# Patient Record
Sex: Female | Born: 1935 | Race: White | Hispanic: No | Smoking: Former smoker
Health system: Southern US, Community
[De-identification: ages and names within clinical notes are randomized; demographics above are authoritative.]

## PROBLEM LIST (undated history)

## (undated) DIAGNOSIS — E559 Vitamin D deficiency, unspecified: Secondary | ICD-10-CM

## (undated) DIAGNOSIS — F419 Anxiety disorder, unspecified: Secondary | ICD-10-CM

## (undated) DIAGNOSIS — M8580 Other specified disorders of bone density and structure, unspecified site: Secondary | ICD-10-CM

## (undated) DIAGNOSIS — F32A Depression, unspecified: Secondary | ICD-10-CM

## (undated) DIAGNOSIS — C801 Malignant (primary) neoplasm, unspecified: Secondary | ICD-10-CM

## (undated) DIAGNOSIS — K635 Polyp of colon: Secondary | ICD-10-CM

## (undated) DIAGNOSIS — R739 Hyperglycemia, unspecified: Secondary | ICD-10-CM

## (undated) DIAGNOSIS — E785 Hyperlipidemia, unspecified: Secondary | ICD-10-CM

## (undated) DIAGNOSIS — R32 Unspecified urinary incontinence: Secondary | ICD-10-CM

## (undated) DIAGNOSIS — F329 Major depressive disorder, single episode, unspecified: Secondary | ICD-10-CM

## (undated) DIAGNOSIS — L570 Actinic keratosis: Secondary | ICD-10-CM

## (undated) HISTORY — DX: Vitamin D deficiency, unspecified: E55.9

## (undated) HISTORY — DX: Other specified disorders of bone density and structure, unspecified site: M85.80

## (undated) HISTORY — DX: Hyperlipidemia, unspecified: E78.5

## (undated) HISTORY — PX: BLADDER SURGERY: SHX569

## (undated) HISTORY — DX: Major depressive disorder, single episode, unspecified: F32.9

## (undated) HISTORY — DX: Polyp of colon: K63.5

## (undated) HISTORY — DX: Actinic keratosis: L57.0

## (undated) HISTORY — DX: Hyperglycemia, unspecified: R73.9

## (undated) HISTORY — DX: Anxiety disorder, unspecified: F41.9

## (undated) HISTORY — DX: Malignant (primary) neoplasm, unspecified: C80.1

## (undated) HISTORY — PX: EYE SURGERY: SHX253

## (undated) HISTORY — DX: Unspecified urinary incontinence: R32

## (undated) HISTORY — DX: Depression, unspecified: F32.A

---

## 2005-06-01 ENCOUNTER — Ambulatory Visit: Payer: Self-pay | Admitting: Internal Medicine

## 2006-07-19 ENCOUNTER — Ambulatory Visit: Payer: Self-pay | Admitting: Internal Medicine

## 2007-10-31 ENCOUNTER — Ambulatory Visit: Payer: Self-pay | Admitting: Internal Medicine

## 2008-11-05 ENCOUNTER — Ambulatory Visit: Payer: Self-pay | Admitting: Internal Medicine

## 2009-11-10 ENCOUNTER — Ambulatory Visit: Payer: Self-pay | Admitting: Internal Medicine

## 2011-12-12 ENCOUNTER — Ambulatory Visit: Payer: Self-pay | Admitting: Internal Medicine

## 2013-03-31 ENCOUNTER — Ambulatory Visit (INDEPENDENT_AMBULATORY_CARE_PROVIDER_SITE_OTHER): Payer: BC Managed Care – PPO | Admitting: Podiatry

## 2013-03-31 ENCOUNTER — Ambulatory Visit: Payer: Self-pay | Admitting: Podiatry

## 2013-03-31 ENCOUNTER — Encounter: Payer: Self-pay | Admitting: Podiatry

## 2013-03-31 VITALS — BP 118/68 | HR 62 | Resp 18 | Ht 64.0 in | Wt 143.0 lb

## 2013-03-31 DIAGNOSIS — M79609 Pain in unspecified limb: Secondary | ICD-10-CM

## 2013-03-31 DIAGNOSIS — B351 Tinea unguium: Secondary | ICD-10-CM

## 2013-03-31 NOTE — Progress Notes (Signed)
Trim toenails .  Objective: Vital signs are stable she is alert and oriented x3. Pulses are palpable bilateral. Nails are thick yellow dystrophic clinically mycotic and painful palpation.  Assessment: Pain in limb secondary to onychomycosis 1 through 5 bilateral.  Plan: Debridement of nails 1 through 5 bilateral covered service secondary to pain.

## 2013-04-14 ENCOUNTER — Ambulatory Visit: Payer: Self-pay | Admitting: Podiatry

## 2013-04-16 ENCOUNTER — Ambulatory Visit: Payer: Self-pay | Admitting: Podiatry

## 2015-07-22 ENCOUNTER — Encounter: Payer: Self-pay | Admitting: Psychiatry

## 2015-07-22 ENCOUNTER — Ambulatory Visit (INDEPENDENT_AMBULATORY_CARE_PROVIDER_SITE_OTHER): Payer: 59 | Admitting: Psychiatry

## 2015-07-22 VITALS — BP 122/66 | HR 61 | Ht 64.0 in | Wt 145.4 lb

## 2015-07-22 DIAGNOSIS — F331 Major depressive disorder, recurrent, moderate: Secondary | ICD-10-CM | POA: Diagnosis not present

## 2015-07-22 DIAGNOSIS — E785 Hyperlipidemia, unspecified: Secondary | ICD-10-CM | POA: Insufficient documentation

## 2015-07-22 DIAGNOSIS — R739 Hyperglycemia, unspecified: Secondary | ICD-10-CM | POA: Insufficient documentation

## 2015-07-22 DIAGNOSIS — M8580 Other specified disorders of bone density and structure, unspecified site: Secondary | ICD-10-CM | POA: Insufficient documentation

## 2015-07-22 DIAGNOSIS — E559 Vitamin D deficiency, unspecified: Secondary | ICD-10-CM | POA: Insufficient documentation

## 2015-07-22 DIAGNOSIS — I10 Essential (primary) hypertension: Secondary | ICD-10-CM | POA: Insufficient documentation

## 2015-07-22 MED ORDER — BUSPIRONE HCL 5 MG PO TABS: 5.0000 mg | ORAL_TABLET | Freq: Every day | ORAL | Status: DC

## 2015-07-22 NOTE — Progress Notes (Signed)
Psychiatric Initial Adult Assessment   Patient Identification: Victoria Marquez MRN:  [id number] Date of Evaluation:  07/22/2015 Referral Source: PCP- [facility]  Chief Complaint:   Chief Complaint    Establish Care; Anxiety; Depression     Visit Diagnosis:    ICD-9-CM ICD-10-CM   1. MDD (major depressive disorder), recurrent episode, moderate (HCC) 296.32 F33.1     History of Present Illness:    Patient is a 82 year old widowed female who presented for initial assessment. She reported that she is having anxiety and depression as she is approaching her 90 per day. Her daughter-in-law who lives in [place] made arrangement for her 19 per day but she became very anxious and depressed and she asked her to cancel the program. Her sons and daughters are very excited and they were planning for the party. However patient reported that she is very introverted and stoic and does not want to have any programs. She currently lives in twin [place]. She does not want to invite her friends and discuss with them about her 89th per day. She reported that she has been becoming more apprehensive and was having anxiety. She spends most of the time reading books and living by herself. She reported that she started having options with her sleep. Currently she denied having any worsening of her symptoms as now she is planning to spend weekends with her children on an individual basis. Patient reported that she is also planning to go to [place] with her daughter. One of her daughter lives in [place]. Her son also lives in [place]. Her other daughter lives in [place]. She has good relationship with her children. She currently denied having any suicidal ideations or plans. She stated that she  has good eating habits.   She is willing to try medications to help with her anxiety at this time.   Associated Signs/Symptoms: Depression Symptoms:  depressed mood, difficulty concentrating, anxiety, (Hypo)  Manic Symptoms:  none Anxiety Symptoms:  Excessive Worry, Psychotic Symptoms:  none PTSD Symptoms: Negative NA  Past Psychiatric History: Has seen a Psychiatrist 50 years ago.   Previous Psychotropic Medications: none  Substance Abuse History in the last 12 months:  No.  Consequences of Substance Abuse: Negative NA  Past Medical History:  Past Medical History  Diagnosis Date  . Cancer ([place])   . Anxiety   . Depression   . Colon polyps   . Hyperglycemia   . Hyperlipemia   . Osteopenia   . Urinary incontinence   . Vitamin D deficiency     Past Surgical History  Procedure Laterality Date  . Bladder surgery    . Eye surgery      Family Psychiatric History:  Oldest daughter has MS Other daughter has Fibromylagia.   Family History: History reviewed. No pertinent family history.  Social History:   Social History   Social History  . Marital Status: Married    Spouse Name: N/A  . Number of Children: N/A  . Years of Education: N/A   Social History Main Topics  . Smoking status: Former Smoker    Types: Cigarettes    Quit date: 07/22/1990  . Smokeless tobacco: Never Used  . Alcohol Use: 0.0 oz/week    0 Standard drinks or equivalent per week  . Drug Use: No  . Sexual Activity: Not Currently   Other Topics Concern  . None   Social History Narrative    Additional Social History:  4 children, 5 grandchildren.  Widowed x 2 years. Married x 62 years.   Allergies:   Allergies  Allergen Reactions  . Codeine Other (See Comments)    Does not remember reaction     Metabolic Disorder Labs: No results found for: HGBA1C, MPG No results found for: PROLACTIN No results found for: CHOL, TRIG, HDL, CHOLHDL, VLDL, LDLCALC   Current Medications: Current Outpatient Prescriptions  Medication Sig Dispense Refill  . alendronate (FOSAMAX) 70 MG tablet     . amLODipine (NORVASC) 5 MG tablet TAKE 1 TABLET (5 MG TOTAL) BY MOUTH ONCE DAILY.  4  . azelastine (ASTELIN)  0.1 % nasal spray Place into the nose.    . Biotin 1000 MCG tablet Take by mouth.    . Cholecalciferol (VITAMIN D-1000 MAX ST) 1000 units tablet Take by mouth.    . ENABLEX 15 MG 24 hr tablet     . fluticasone (FLONASE) 50 MCG/ACT nasal spray Place into the nose.    . Folic Acid 20 MG CAPS Take by mouth.    . losartan (COZAAR) 100 MG tablet     . Omega-3 Fatty Acids (FISH OIL) 1000 MG CAPS Take by mouth.    . pantoprazole (PROTONIX) 40 MG tablet      No current facility-administered medications for this visit.    Neurologic: Headache: No Seizure: No Paresthesias:No  Musculoskeletal: Strength & Muscle Tone: within normal limits Gait & Station: normal Patient leans: N/A  Psychiatric Specialty Exam: ROS  Blood pressure 122/66, pulse 61, height 5\' 4"  (1.626 m), weight 145 lb 6.4 oz (65.953 kg), SpO2 91 %.Body mass index is 24.95 kg/(m^2).  General Appearance: Casual and Fairly Groomed  Eye Contact:  Fair  Speech:  Slow  Volume:  Normal  Mood:  Depressed  Affect:  Appropriate and Congruent  Thought Process:  Coherent and Goal Directed  Orientation:  Full (Time, Place, and Person)  Thought Content:  WDL  Suicidal Thoughts:  No  Homicidal Thoughts:  No  Memory:  Immediate;   Fair Recent;   Fair  Judgement:  Fair  Insight:  Fair  Psychomotor Activity:  Normal  Concentration:  Concentration: Fair and Attention Span: Fair  Recall:  [employer] of Knowledge:Fair  Language: Fair  Akathisia:  No  Handed:  Right  AIMS (if indicated):    Assets:  Communication Skills Desire for Improvement Leisure Time Physical Health Social Support  ADL's:  Intact  Cognition: WNL  Sleep:  fair    Treatment Plan Summary: Medication management   Discussed with her about the medications and she agreed for a trial of BuSpar 5 mg daily. Discussed with her about the side effects in detail and she demonstrated understanding. Follow up in 2 weeks or earlier depending on her symptoms.    More  than 50% of the time spent in psychoeducation, counseling and coordination of care.    This note was generated in part or whole with voice recognition software. Voice regonition is usually quite accurate but there are transcription errors that can and very often do occur. I apologize for any typographical errors that were not detected and corrected.    Rainey Pines, MD 7/13/201711:17 AM

## 2015-08-05 ENCOUNTER — Encounter: Payer: Self-pay | Admitting: Psychiatry

## 2015-08-05 ENCOUNTER — Ambulatory Visit (INDEPENDENT_AMBULATORY_CARE_PROVIDER_SITE_OTHER): Payer: 59 | Admitting: Psychiatry

## 2015-08-05 ENCOUNTER — Ambulatory Visit (INDEPENDENT_AMBULATORY_CARE_PROVIDER_SITE_OTHER): Payer: 59 | Admitting: Licensed Clinical Social Worker

## 2015-08-05 VITALS — BP 131/73 | HR 70 | Temp 97.7°F | Ht 64.0 in | Wt 147.2 lb

## 2015-08-05 DIAGNOSIS — F4323 Adjustment disorder with mixed anxiety and depressed mood: Secondary | ICD-10-CM

## 2015-08-05 NOTE — Progress Notes (Signed)
Psychiatric MD Progress Note   Patient Identification: Victoria Marquez MRN:  [id number] Date of Evaluation:  08/05/2015 Referral Source: PCP- [facility]  Chief Complaint:   Chief Complaint    Follow-up; Medication Refill     Visit Diagnosis:    ICD-9-CM ICD-10-CM   1. Adjustment disorder with mixed anxiety and depressed mood 309.28 F43.23     History of Present Illness:    Patient is a 82 year old widowed female who presented for Follow-up. She reported that she has been doing well and does not have any acute issues. She reported that she has been taking her BuSpar as prescribed. Patient reported that she did not notice any worsening of her symptoms and did not notice any improvement either. She wants to start doing therapy to discuss about her personality issues in the past. She has good relationship with her children. She did not have any acute issues at this time. Patient reported that she is very proud of her health as she does not have any symptoms and her mother lived to be 70 years old.    She has lots of energy and she reads books throughout the day and sleeps approximately 5-6 hours on a daily basis. She currently denied having any suicidal ideations or plans. She denied having any memory issues.   She currently lives in twin [place].  She has good relationship with her children. She currently denied having any suicidal ideations or plans. She stated that she  has good eating habits.    Associated Signs/Symptoms: Depression Symptoms:  difficulty concentrating, anxiety, (Hypo) Manic Symptoms:  none Anxiety Symptoms:  Excessive Worry, Psychotic Symptoms:  none PTSD Symptoms: Negative NA  Past Psychiatric History: Has seen a Psychiatrist 50 years ago.   Previous Psychotropic Medications: none  Substance Abuse History in the last 12 months:  No.  Consequences of Substance Abuse: Negative NA  Past Medical History:  Past Medical History:  Diagnosis Date  . Anxiety    . Cancer ([place])   . Colon polyps   . Depression   . Hyperglycemia   . Hyperlipemia   . Osteopenia   . Urinary incontinence   . Vitamin D deficiency     Past Surgical History:  Procedure Laterality Date  . BLADDER SURGERY    . EYE SURGERY      Family Psychiatric History:  Oldest daughter has MS Other daughter has [place].   Family History: History reviewed. No pertinent family history.  Social History:   Social History   Social History  . Marital status: Married    Spouse name: N/A  . Number of children: N/A  . Years of education: N/A   Social History Main Topics  . Smoking status: Former Smoker    Types: Cigarettes    Quit date: 07/22/1990  . Smokeless tobacco: Never Used  . Alcohol use 0.0 oz/week  . Drug use: No  . Sexual activity: Not Currently   Other Topics Concern  . None   Social History Narrative  . None    Additional Social History:  4 children, 5 grandchildren.  Widowed x 2 years. Married x 62 years.   Allergies:   Allergies  Allergen Reactions  . Codeine Other (See Comments)    Does not remember reaction     Metabolic Disorder Labs: No results found for: HGBA1C, MPG No results found for: PROLACTIN No results found for: CHOL, TRIG, HDL, CHOLHDL, VLDL, LDLCALC   Current Medications: Current Outpatient Prescriptions  Medication Sig Dispense Refill  .  alendronate (FOSAMAX) 70 MG tablet     . amLODipine (NORVASC) 5 MG tablet TAKE 1 TABLET (5 MG TOTAL) BY MOUTH ONCE DAILY.  4  . azelastine (ASTELIN) 0.1 % nasal spray Place into the nose.    . Biotin 1000 MCG tablet Take by mouth.    . busPIRone (BUSPAR) 5 MG tablet Take 1 tablet (5 mg total) by mouth daily after breakfast. 30 tablet 1  . Cholecalciferol (VITAMIN D-1000 MAX ST) 1000 units tablet Take by mouth.    . ENABLEX 15 MG 24 hr tablet     . fluticasone (FLONASE) 50 MCG/ACT nasal spray Place into the nose.    . Folic Acid 20 MG CAPS Take by mouth.    . losartan (COZAAR) 100 MG  tablet     . Omega-3 Fatty Acids (FISH OIL) 1000 MG CAPS Take by mouth.    . pantoprazole (PROTONIX) 40 MG tablet      No current facility-administered medications for this visit.     Neurologic: Headache: No Seizure: No Paresthesias:No  Musculoskeletal: Strength & Muscle Tone: within normal limits Gait & Station: normal Patient leans: N/A  Psychiatric Specialty Exam: Review of Systems  Psychiatric/Behavioral: Positive for depression. The patient is nervous/anxious and has insomnia.     Blood pressure 131/73, pulse 70, temperature 97.7 F (36.5 C), temperature source Oral, height 5\' 4"  (1.626 m), weight 147 lb 3.2 oz (66.8 kg).Body mass index is 25.27 kg/m.  General Appearance: Casual and Fairly Groomed  Eye Contact:  Fair  Speech:  Slow  Volume:  Normal  Mood:  Depressed  Affect:  Appropriate and Congruent  Thought Process:  Coherent and Goal Directed  Orientation:  Full (Time, Place, and Person)  Thought Content:  WDL  Suicidal Thoughts:  No  Homicidal Thoughts:  No  Memory:  Immediate;   Fair Recent;   Fair  Judgement:  Fair  Insight:  Fair  Psychomotor Activity:  Normal  Concentration:  Concentration: Fair and Attention Span: Fair  Recall:  [employer] of Knowledge:Fair  Language: Fair  Akathisia:  No  Handed:  Right  AIMS (if indicated):    Assets:  Communication Skills Desire for Improvement Leisure Time Physical Health Social Support  ADL's:  Intact  Cognition: WNL  Sleep:  fair    Treatment Plan Summary: Medication management   Discussed with her about the medications and she agreed for a trial of BuSpar 5 mg daily. Discussed with her about the side effects in detail and she demonstrated understanding. Follow up in 4 weeks or earlier depending on her symptoms. She will be referred for therapy with Elmyra Ricks on a regular basis and she agreed with the plan.   More than 50% of the time spent in psychoeducation, counseling and coordination of care.     This note was generated in part or whole with voice recognition software. Voice regonition is usually quite accurate but there are transcription errors that can and very often do occur. I apologize for any typographical errors that were not detected and corrected.    Rainey Pines, MD 7/27/201710:42 AM

## 2015-08-05 NOTE — Progress Notes (Signed)
Comprehensive Clinical Assessment (CCA) Note  08/05/2015 Victoria Marquez [id number]  Visit Diagnosis:   No diagnosis found.    CCA Part One  Part One has been completed on paper by the patient.  (See scanned document in Chart Review)  CCA Part Two A  Intake/Chief Complaint:  CCA Intake With Chief Complaint CCA Part Two Date: 08/05/15 CCA Part Two Time: 72 Chief Complaint/Presenting Problem: I am "anxious" all the time Patients Currently Reported Symptoms/Problems: discomfort, stomach discomfort, difficulty with planning a birthday party for her 89th birthday Individual's Strengths: disciplined, healthy, organized Individual's Preferences: to not celebrate her birthday Individual's Abilities: motivated for treatment Type of Services Patient Feels Are Needed: medication mangagement  Mental Health Symptoms Depression:  Depression: N/A  Mania:  Mania: N/A  Anxiety:   Anxiety: Difficulty concentrating, Irritability, Restlessness, Sleep, Worrying  Psychosis:  Psychosis: N/A  Trauma:  Trauma: N/A  Obsessions:  Obsessions: N/A  Compulsions:  Compulsions: N/A  Inattention:  Inattention: N/A  Hyperactivity/Impulsivity:  Hyperactivity/Impulsivity: N/A  Oppositional/Defiant Behaviors:  Oppositional/Defiant Behaviors: N/A  Borderline Personality:  Emotional Irregularity: N/A  Other Mood/Personality Symptoms:      Mental Status Exam Appearance and self-care  Stature:  Stature: Average  Weight:  Weight: Average weight  Clothing:  Clothing: Casual, Neat/clean  Grooming:  Grooming: Normal  Cosmetic use:  Cosmetic Use: Age appropriate  Posture/gait:  Posture/Gait: Normal  Motor activity:  Motor Activity: Not Remarkable  Sensorium  Attention:  Attention: Normal  Concentration:  Concentration: Normal  Orientation:  Orientation: X5  Recall/memory:  Recall/Memory: Normal  Affect and Mood  Affect:  Affect: Appropriate  Mood:  Mood: Anxious  Relating  Eye contact:  Eye Contact:  Normal  Facial expression:  Facial Expression: Responsive  Attitude toward examiner:  Attitude Toward Examiner: Cooperative  Thought and Language  Speech flow: Speech Flow: Normal  Thought content:  Thought Content: Appropriate to mood and circumstances  Preoccupation:     Hallucinations:     Organization:     Transport planner of Knowledge:  Fund of Knowledge: Average  Intelligence:  Intelligence: Average  Abstraction:  Abstraction: Normal  Judgement:  Judgement: Normal  Reality Testing:  Reality Testing: Adequate  Insight:  Insight: Good  Decision Making:  Decision Making: Normal  Social Functioning  Social Maturity:  Social Maturity: Responsible  Social Judgement:  Social Judgement: Normal  Stress  Stressors:  Stressors: Transitions  Coping Ability:  Coping Ability: Normal  Skill Deficits:     Supports:      Family and Psychosocial History: Family history Marital status: Widowed Widowed, when?: 2015 Are you sexually active?: No What is your sexual orientation?: heterosexual Has your sexual activity been affected by drugs, alcohol, medication, or emotional stress?: no Does patient have children?: Yes How many children?: 5 (Ann 38, Arnell Sieving (deceased at the age of 2 months), Leslie 59, Jeffrey 57, Richard 56 (& has 5 Grandchildren) ) How is patient's relationship with their children?: has a good positive relatinship with all of her children  Childhood History:  Childhood History By whom was/is the patient raised?: Both parents Additional childhood history information: Born in [place].  Reports that her family did not suffer during the depression.  Father was a Psychologist, counselling.  [place] the [place]ers were spent.  Mother: Network engineer of the family business Description of patient's relationship with caregiver when they were a child: Mother: "good"  Father: "stern and the disciplinarian" Patient's description of  current  relationship with people who raised him/her: deceased How were you disciplined when you got in trouble as a child/adolescent?: "we got away with a lot of stuff" Does patient have siblings?: Yes Number of Siblings: 5 Description of patient's current relationship with siblings: great Did patient suffer any verbal/emotional/physical/sexual abuse as a child?: No Did patient suffer from severe childhood neglect?: No Has patient ever been sexually abused/assaulted/raped as an adolescent or adult?: No Was the patient ever a victim of a crime or a disaster?: No Witnessed domestic violence?: No Has patient been effected by domestic violence as an adult?: No  CCA Part Two B  Employment/Work Situation: Employment / Work Copywriter, advertising Employment situation: Retired Archivist job has been impacted by current illness: No What is the longest time patient has a held a job?: 5 Where was the patient employed at that time?: [employer] Has patient ever been in the [employer]?: No Has patient ever served in combat?: No Did You Receive Any Psychiatric Treatment/Services While in Passenger transport manager?: No Are There Guns or Other Weapons in [place]?: No  Education: Education Did Teacher, adult education From [employer]?: Yes Did Physicist, medical?: Yes What Type of College Degree Do you Have?: Education Did Heritage manager?: Yes What is Your Press photographer?: Masters in [employer] Did You Have An Individualized Education Program (IIEP): No Did You Have Any Difficulty At [employer]?: No  Religion: Religion/Spirituality Are You A Religious Person?: Yes What is Your Religious Affiliation?: [place] How Might This Affect Treatment?: denies  Leisure/Recreation: Leisure / Recreation Leisure and Hobbies: sing in [place], [place], reading, book clubs  Exercise/Diet: Exercise/Diet Do You Exercise?: Yes What Type of Exercise Do You Do?: Dance How Many Times a Week Do You  Exercise?: 1-3 times a week Have You Gained or Lost A Significant Amount of Weight in the Past Six Months?: No Do You Follow a Special Diet?: No Do You Have Any Trouble Sleeping?: No  CCA Part Two C  Alcohol/Drug Use: Alcohol / Drug Use Pain Medications: denies History of alcohol / drug use?: Yes Substance #1 Name of Substance 1: Alcohol 1 - Age of First Use: "in college.  I don't remember" 1 - Amount (size/oz): 6 ounces of wine 1 - Frequency: daily 1 - Duration: "probably since 1951" 1 - Last Use / Amount: last night                    CCA Part Three  ASAM's:  Six Dimensions of Multidimensional Assessment  Dimension 1:  Acute Intoxication and/or Withdrawal Potential:     Dimension 2:  Biomedical Conditions and Complications:     Dimension 3:  Emotional, Behavioral, or Cognitive Conditions and Complications:     Dimension 4:  Readiness to Change:     Dimension 5:  Relapse, Continued use, or Continued Problem Potential:     Dimension 6:  Recovery/Living Environment:      Substance use Disorder (SUD)    Social Function:  Social Functioning Social Maturity: Responsible Social Judgement: Normal  Stress:  Stress Stressors: Transitions Coping Ability: Normal Patient Takes Medications The Way The Doctor Instructed?: Yes Priority Risk: Low Acuity  Risk Assessment- Self-Harm Potential: Risk Assessment For Self-Harm Potential Thoughts of Self-Harm: No current thoughts Method: No plan Availability of Means: No access/NA  Risk Assessment -Dangerous to Others Potential: Risk Assessment For Dangerous to Others Potential Method: No Plan Availability of Means: No access  or NA Intent: Vague intent or NA Notification Required: No need or identified person  DSM5 Diagnoses: Patient Active Problem List   Diagnosis Date Noted  . Blood glucose elevated 07/22/2015  . HLD (hyperlipidemia) 07/22/2015  . BP (high blood pressure) 07/22/2015  . Osteopenia 07/22/2015  .  Avitaminosis D 07/22/2015    Patient Centered Plan: Patient is on the following Treatment Plan(s):  Anxiety and Depression  Recommendations for Services/Supports/Treatments: Recommendations for Services/Supports/Treatments Recommendations For Services/Supports/Treatments: Individual Therapy, Medication Management  Treatment Plan Summary:    Referrals to Alternative Service(s): Referred to Alternative Service(s):   Place:   Date:   Time:    Referred to Alternative Service(s):   Place:   Date:   Time:    Referred to Alternative Service(s):   Place:   Date:   Time:    Referred to Alternative Service(s):   Place:   Date:   Time:     [place]

## 2015-08-19 ENCOUNTER — Ambulatory Visit: Payer: Medicare Other | Admitting: Licensed Clinical Social Worker

## 2015-08-31 ENCOUNTER — Ambulatory Visit: Payer: Medicare Other | Admitting: Psychiatry

## 2016-08-28 DIAGNOSIS — N3945 Continuous leakage: Secondary | ICD-10-CM | POA: Insufficient documentation

## 2016-12-06 DIAGNOSIS — N3946 Mixed incontinence: Secondary | ICD-10-CM | POA: Insufficient documentation

## 2016-12-06 DIAGNOSIS — Z9889 Other specified postprocedural states: Secondary | ICD-10-CM | POA: Insufficient documentation

## 2016-12-21 DIAGNOSIS — R200 Anesthesia of skin: Secondary | ICD-10-CM | POA: Insufficient documentation

## 2017-12-02 ENCOUNTER — Encounter: Payer: Self-pay | Admitting: Emergency Medicine

## 2017-12-02 ENCOUNTER — Emergency Department
Admission: EM | Admit: 2017-12-02 | Discharge: 2017-12-02 | Disposition: A | Discharge status: Home / Self Care | Payer: Medicare HMO | Attending: Emergency Medicine | Admitting: Emergency Medicine

## 2017-12-02 ENCOUNTER — Emergency Department: Payer: Medicare HMO

## 2017-12-02 ENCOUNTER — Other Ambulatory Visit: Payer: Self-pay

## 2017-12-02 DIAGNOSIS — W010XXA Fall on same level from slipping, tripping and stumbling without subsequent striking against object, initial encounter: Secondary | ICD-10-CM | POA: Diagnosis not present

## 2017-12-02 DIAGNOSIS — S42291A Other displaced fracture of upper end of right humerus, initial encounter for closed fracture: Secondary | ICD-10-CM | POA: Diagnosis not present

## 2017-12-02 DIAGNOSIS — Y999 Unspecified external cause status: Secondary | ICD-10-CM | POA: Insufficient documentation

## 2017-12-02 DIAGNOSIS — Y929 Unspecified place or not applicable: Secondary | ICD-10-CM | POA: Diagnosis not present

## 2017-12-02 DIAGNOSIS — Z79899 Other long term (current) drug therapy: Secondary | ICD-10-CM | POA: Diagnosis not present

## 2017-12-02 DIAGNOSIS — Y9301 Activity, walking, marching and hiking: Secondary | ICD-10-CM | POA: Insufficient documentation

## 2017-12-02 DIAGNOSIS — S4991XA Unspecified injury of right shoulder and upper arm, initial encounter: Secondary | ICD-10-CM | POA: Diagnosis present

## 2017-12-02 DIAGNOSIS — Z87891 Personal history of nicotine dependence: Secondary | ICD-10-CM | POA: Insufficient documentation

## 2017-12-02 MED ORDER — OXYCODONE-ACETAMINOPHEN 5-325 MG PO TABS: 1.0000 | ORAL_TABLET | ORAL | 0 refills | Status: AC | PRN

## 2017-12-02 MED ORDER — OXYCODONE-ACETAMINOPHEN 5-325 MG PO TABS
1.0000 | ORAL_TABLET | Freq: Once | ORAL | Status: AC
  Administered 2017-12-02: 1 via ORAL
  Filled 2017-12-02: qty 1

## 2017-12-02 NOTE — ED Triage Notes (Addendum)
Pt in via [facility] from [facility].  Pt with mechanical fall, tripping over rug, reports right upper arm pain from shoulder to elbow w/ decreased range of motion, obvious deformity noted.  Bilateral pulses equal.  Pt denies hitting head, denies blood thinners.

## 2017-12-02 NOTE — ED Notes (Signed)
Shoulder immobilizer placed per this RN.

## 2017-12-02 NOTE — ED Provider Notes (Signed)
[facility] Emergency Department Provider Note   ____________________________________________    I have reviewed the triage vital signs and the nursing notes.   HISTORY  Chief Complaint Arm Injury     HPI Victoria Marquez is a 84 y.o. female who presents with complaints of right arm pain.  Patient reports she tripped and fell onto her right arm.  She complains of right upper arm pain which is severe with any movement.  Denies head injury.  No chest wall pain.  No neck pain.  No abdominal pain.  No lower extremity or hip pain.  No back pain   Past Medical History:  Diagnosis Date  . Anxiety   . Cancer ([place])   . Colon polyps   . Depression   . Hyperglycemia   . Hyperlipemia   . Osteopenia   . Urinary incontinence   . Vitamin D deficiency     Patient Active Problem List   Diagnosis Date Noted  . Blood glucose elevated 07/22/2015  . HLD (hyperlipidemia) 07/22/2015  . BP (high blood pressure) 07/22/2015  . Osteopenia 07/22/2015  . Avitaminosis D 07/22/2015    Past Surgical History:  Procedure Laterality Date  . BLADDER SURGERY    . EYE SURGERY      Prior to Admission medications   Medication Sig Start Date End Date Taking? Authorizing Provider  alendronate (FOSAMAX) 70 MG tablet  03/22/13   [provider]  amLODipine (NORVASC) 5 MG tablet TAKE 1 TABLET (5 MG TOTAL) BY MOUTH ONCE DAILY. 07/02/15   [provider]  azelastine (ASTELIN) 0.1 % nasal spray Place into the nose. 10/08/14 10/08/15  [provider]  Biotin 1000 MCG tablet Take by mouth.    [provider]  busPIRone (BUSPAR) 5 MG tablet Take 1 tablet (5 mg total) by mouth daily after breakfast. 07/22/15   Rainey Pines, MD  Cholecalciferol (VITAMIN D-1000 MAX ST) 1000 units tablet Take by mouth.    [provider]  ENABLEX 15 MG 24 hr tablet  03/22/13   [provider]  fluticasone (FLONASE) 50 MCG/ACT nasal spray Place into the  nose.    [provider]  Folic Acid 20 MG CAPS Take by mouth.    [provider]  losartan (COZAAR) 100 MG tablet  03/22/13   [provider]  Omega-3 Fatty Acids (FISH OIL) 1000 MG CAPS Take by mouth.    [provider]  oxyCODONE-acetaminophen (PERCOCET) 5-325 MG tablet Take 1 tablet by mouth every 4 (four) hours as needed for severe pain. 12/02/17 12/02/18  Lavonia Drafts, MD  pantoprazole (PROTONIX) 40 MG tablet  03/10/13   [provider]     Allergies Codeine  No family history on file.  Social History Social History   Tobacco Use  . Smoking status: Former Smoker    Types: Cigarettes    Last attempt to quit: 07/22/1990    Years since quitting: 27.3  . Smokeless tobacco: Never Used  Substance Use Topics  . Alcohol use: Yes    Alcohol/week: 0.0 standard drinks  . Drug use: No    Review of Systems  Constitutional: No fever/chills Eyes: No visual changes.  ENT: No neck pain Cardiovascular: Denies chest pain. Respiratory: Denies shortness of breath. Gastrointestinal: No abdominal pain. Genitourinary: No groin injury Musculoskeletal: As above Skin: Negative for pallor Neurological: Negative for headaches   ____________________________________________   PHYSICAL EXAM:  VITAL SIGNS: ED Triage Vitals  Enc Vitals Group  BP 12/02/17 1603 (!) 146/60     Pulse Rate 12/02/17 1603 (!) 54     Resp 12/02/17 1603 16     Temp 12/02/17 1603 98 F (36.7 C)     Temp src --      SpO2 12/02/17 1603 98 %     Weight 12/02/17 1604 63.5 kg (140 lb)     Height 12/02/17 1604 1.626 m (5\' 4" )     Head Circumference --      Peak Flow --      Pain Score 12/02/17 1604 8     Pain Loc --      Pain Edu? --      Excl. in [place]? --     Constitutional: Alert and oriented.  Head: Atraumatic Mouth/Throat: Mucous membranes are moist.   Neck:  Painless ROM, no vertebral chest palpation Cardiovascular: Normal rate, regular rhythm. Good  peripheral circulation.  No chest wall tenderness to palpation Respiratory: Normal respiratory effort.  No retractions.  Gastrointestinal: Soft and nontender. No distention.    Musculoskeletal: Deformity of the proximal right humerus, extremity is warm and well perfused no mottling, neuro intact Neurologic:  Normal speech and language. No gross focal neurologic deficits are appreciated.  Skin:  Skin is warm, dry and intact. No rash noted. Psychiatric: Mood and affect are normal. Speech and behavior are normal.  ____________________________________________   LABS (all labs ordered are listed, but only abnormal results are displayed)  Labs Reviewed - No data to display ____________________________________________  EKG  None ____________________________________________  RADIOLOGY  Proximal right humeral neck fracture with comminution and mild displacement ____________________________________________   PROCEDURES  Procedure(s) performed: No  Procedures   Critical Care performed: No ____________________________________________   INITIAL IMPRESSION / ASSESSMENT AND PLAN / ED COURSE  Pertinent labs & imaging results that were available during my care of the patient were reviewed by me and considered in my medical decision making (see chart for details).  Patient's exam is suspicious for proximal humerus fracture, confirmed by x-ray.  Pain treated with Percocet p.o.  Shoulder immobilizer placed, discussed with Dr. Mack Guise who will see the patient in his office    ____________________________________________   FINAL CLINICAL IMPRESSION(S) / ED DIAGNOSES  Final diagnoses:  Other closed displaced fracture of proximal end of right humerus, initial encounter        Note:  This document was prepared using Dragon voice recognition software and may include unintentional dictation errors.    Lavonia Drafts, MD 12/02/17 [phone]

## 2017-12-02 NOTE — ED Notes (Signed)
Patient transported to X-ray 

## 2017-12-02 NOTE — ED Notes (Signed)
EDP to bedside to provide pt and family with update. 

## 2017-12-11 DIAGNOSIS — M810 Age-related osteoporosis without current pathological fracture: Secondary | ICD-10-CM

## 2017-12-11 DIAGNOSIS — I10 Essential (primary) hypertension: Secondary | ICD-10-CM

## 2017-12-11 DIAGNOSIS — S42201A Unspecified fracture of upper end of right humerus, initial encounter for closed fracture: Secondary | ICD-10-CM

## 2017-12-11 DIAGNOSIS — R32 Unspecified urinary incontinence: Secondary | ICD-10-CM | POA: Diagnosis not present

## 2019-05-09 ENCOUNTER — Other Ambulatory Visit: Payer: Self-pay

## 2019-05-09 ENCOUNTER — Ambulatory Visit: Payer: Medicare HMO | Admitting: Dermatology

## 2019-05-09 DIAGNOSIS — L578 Other skin changes due to chronic exposure to nonionizing radiation: Secondary | ICD-10-CM | POA: Diagnosis not present

## 2019-05-09 DIAGNOSIS — Z872 Personal history of diseases of the skin and subcutaneous tissue: Secondary | ICD-10-CM

## 2019-05-09 DIAGNOSIS — L820 Inflamed seborrheic keratosis: Secondary | ICD-10-CM | POA: Diagnosis not present

## 2019-05-09 DIAGNOSIS — L821 Other seborrheic keratosis: Secondary | ICD-10-CM

## 2019-05-09 NOTE — Patient Instructions (Signed)
Cryotherapy Aftercare  . Wash gently with soap and water everyday.   . Apply Vaseline and Band-Aid daily until healed.  

## 2019-05-09 NOTE — Progress Notes (Signed)
    Follow-Up Visit   Subjective  Victoria Marquez is a 86 y.o. female who presents for the following: Skin Problem.  Patient here today for a spot on the right forehead. Present for > 1 month, scaly, persistent. Patient also has a flaky red spot at left cheek near nose. Present for < 1 month with no symptoms. Patient with history of AK's.   The following portions of the chart were reviewed this encounter and updated as appropriate:     Review of Systems:  No other skin or systemic complaints except as noted in HPI or Assessment and Plan.  Objective  Well appearing patient in no apparent distress; mood and affect are within normal limits.  A focused examination was performed including face. Relevant physical exam findings are noted in the Assessment and Plan.  Objective  R lat forehead, L upper nasolabial, L index finger, R wrist (4): Pink keratotic macules and papules slightly waxy   Assessment & Plan  Inflamed seborrheic keratosis (4) R lat forehead, L upper nasolabial, L index finger, R wrist  Vs HyAK Recheck on f/up  Destruction of lesion - R lat forehead, L upper nasolabial, L index finger, R wrist  Destruction method: cryotherapy   Informed consent: discussed and consent obtained   Timeout:  patient name, date of birth, surgical site, and procedure verified Lesion destroyed using liquid nitrogen: Yes   Region frozen until ice ball extended beyond lesion: Yes   Outcome: patient tolerated procedure well with no complications   Post-procedure details: wound care instructions given     Actinic Damage - diffuse scaly erythematous macules with underlying dyspigmentation - Recommend daily broad spectrum sunscreen SPF 30+ to sun-exposed areas, reapply every 2 hours as needed.  - Call for new or changing lesions.  Seborrheic Keratoses - Stuck-on, waxy, tan-brown papules and plaques  - Discussed benign etiology and prognosis. - Observe - Call for any changes  Return  in about 3 months (around 08/08/2019) for TBSE.  Luther Redo, CMA, am acting as scribe for Brendolyn Patty, MD .  Documentation: I have reviewed the above documentation for accuracy and completeness, and I agree with the above.  Brendolyn Patty, MD

## 2019-07-01 DIAGNOSIS — R6889 Other general symptoms and signs: Secondary | ICD-10-CM | POA: Insufficient documentation

## 2019-07-23 ENCOUNTER — Ambulatory Visit (INDEPENDENT_AMBULATORY_CARE_PROVIDER_SITE_OTHER): Payer: Medicare HMO | Admitting: Dermatology

## 2019-07-23 ENCOUNTER — Other Ambulatory Visit: Payer: Self-pay

## 2019-07-23 DIAGNOSIS — W57XXXA Bitten or stung by nonvenomous insect and other nonvenomous arthropods, initial encounter: Secondary | ICD-10-CM

## 2019-07-23 DIAGNOSIS — D1800 Hemangioma unspecified site: Secondary | ICD-10-CM

## 2019-07-23 DIAGNOSIS — L820 Inflamed seborrheic keratosis: Secondary | ICD-10-CM

## 2019-07-23 DIAGNOSIS — Z1283 Encounter for screening for malignant neoplasm of skin: Secondary | ICD-10-CM | POA: Diagnosis not present

## 2019-07-23 DIAGNOSIS — L720 Epidermal cyst: Secondary | ICD-10-CM | POA: Diagnosis not present

## 2019-07-23 DIAGNOSIS — S80862A Insect bite (nonvenomous), left lower leg, initial encounter: Secondary | ICD-10-CM | POA: Diagnosis not present

## 2019-07-23 DIAGNOSIS — S70362A Insect bite (nonvenomous), left thigh, initial encounter: Secondary | ICD-10-CM

## 2019-07-23 DIAGNOSIS — D692 Other nonthrombocytopenic purpura: Secondary | ICD-10-CM

## 2019-07-23 DIAGNOSIS — L814 Other melanin hyperpigmentation: Secondary | ICD-10-CM

## 2019-07-23 DIAGNOSIS — Z872 Personal history of diseases of the skin and subcutaneous tissue: Secondary | ICD-10-CM

## 2019-07-23 DIAGNOSIS — L821 Other seborrheic keratosis: Secondary | ICD-10-CM

## 2019-07-23 DIAGNOSIS — L578 Other skin changes due to chronic exposure to nonionizing radiation: Secondary | ICD-10-CM

## 2019-07-23 NOTE — Patient Instructions (Addendum)
Recommend daily broad spectrum sunscreen SPF 30+ to sun-exposed areas, reapply every 2 hours as needed. Call for new or changing lesions.  Cryotherapy Aftercare  . Wash gently with soap and water everyday.   Marland Kitchen Apply Vaseline and Band-Aid daily until healed.  May use samples of Pandel 1-2 times daily as needed for itch.

## 2019-07-23 NOTE — Progress Notes (Signed)
   Follow-Up Visit   Subjective  Victoria Marquez is a 86 y.o. female who presents for the following: Annual Exam.  Patient here today for TBSE. She has a history of AK's and is not aware of any new or changing spots today.   The following portions of the chart were reviewed this encounter and updated as appropriate:      Review of Systems:  No other skin or systemic complaints except as noted in HPI or Assessment and Plan.  Objective  Well appearing patient in no apparent distress; mood and affect are within normal limits.  A full examination was performed including scalp, head, eyes, ears, nose, lips, neck, chest, axillae, abdomen, back, buttocks, bilateral upper extremities, bilateral lower extremities, hands, feet, fingers, toes, fingernails, and toenails. All findings within normal limits unless otherwise noted below.  Objective  R Posterior Upper Arm x 1, R forearm x 1, back x 4, L ankle x 6, chest x 7 (19): Erythematous keratotic stuck-on papules. Pink scaly patch R post upper arm  Objective  Arms: Violaceous macules and patches.   Objective  Left Popliteal, L thigh: Pink edematous papules with mild scale  Objective  Left medial elbow: 59mm firm subq nodule   Assessment & Plan  Inflamed seborrheic keratosis (19) R Posterior Upper Arm x 1, R forearm x 1, back x 4, L ankle x 6, chest x 7  Vrs HyAKs  Recheck right posterior upper arm on follow up.   Destruction of lesion - R Posterior Upper Arm x 1, R forearm x 1, back x 4, L ankle x 6, chest x 7  Destruction method: cryotherapy   Informed consent: discussed and consent obtained   Lesion destroyed using liquid nitrogen: Yes   Region frozen until ice ball extended beyond lesion: Yes   Outcome: patient tolerated procedure well with no complications   Post-procedure details: wound care instructions given    Senile purpura ([place]) Arms  Benign, observe.    Insect bite of multiple sites with local reaction Left  Popliteal, L thigh  Benign, observe.   Samples of Pandel x 2 given to patient to use 1-2 times daily as needed for itch. Lot # [id number]  Exp: Aug 22  Recheck left medial thigh on follow up.    Epidermal inclusion cyst Left medial elbow  Benign, observe. Discussed excision to remove if changes noted or irritating  Recheck on follow up.   Lentigines - Scattered tan macules - Discussed due to sun exposure - Benign, observe - Call for any changes  Seborrheic Keratoses - Stuck-on, waxy, tan-brown papules and plaques  - Discussed benign etiology and prognosis. - Observe - Call for any changes  Hemangiomas - Red papules - Discussed benign nature - Observe - Call for any changes  Actinic Damage - diffuse scaly erythematous macules with underlying dyspigmentation - Recommend daily broad spectrum sunscreen SPF 30+ to sun-exposed areas, reapply every 2 hours as needed.  - Call for new or changing lesions.  Skin cancer screening performed today.   Return in about 6 months (around 01/23/2020) for AK follow up.  Graciella Belton, RMA, am acting as scribe for Brendolyn Patty, MD . Documentation: I have reviewed the above documentation for accuracy and completeness, and I agree with the above.  Brendolyn Patty MD

## 2019-09-01 ENCOUNTER — Ambulatory Visit: Payer: Medicare HMO | Admitting: Dermatology

## 2019-10-31 ENCOUNTER — Other Ambulatory Visit: Payer: Self-pay

## 2019-10-31 ENCOUNTER — Ambulatory Visit: Payer: Medicare HMO | Admitting: Podiatry

## 2019-10-31 DIAGNOSIS — M79674 Pain in right toe(s): Secondary | ICD-10-CM

## 2019-10-31 DIAGNOSIS — B351 Tinea unguium: Secondary | ICD-10-CM

## 2019-10-31 DIAGNOSIS — M2041 Other hammer toe(s) (acquired), right foot: Secondary | ICD-10-CM | POA: Diagnosis not present

## 2019-10-31 DIAGNOSIS — M79675 Pain in left toe(s): Secondary | ICD-10-CM | POA: Diagnosis not present

## 2019-10-31 NOTE — Progress Notes (Signed)
   SUBJECTIVE Patient presents to office today complaining of elongated, thickened nails that cause pain while ambulating in shoes.  She is unable to trim her own nails.  Patient is also concerned that she has developed a hammertoe deformity to the right 2nd digit.  It is increased in deformity over time.  It is only irritating in certain shoes.  For the most part she wear soft shoes that are comfortable and do not irritate the hammertoe.  Patient is here for further evaluation and treatment.  Past Medical History:  Diagnosis Date  . Actinic keratosis   . Anxiety   . Cancer ([place])   . Colon polyps   . Depression   . Hyperglycemia   . Hyperlipemia   . Osteopenia   . Urinary incontinence   . Vitamin D deficiency     OBJECTIVE General Patient is awake, alert, and oriented x 3 and in no acute distress. Derm Skin is dry and supple bilateral. Negative open lesions or macerations. Remaining integument unremarkable. Nails are tender, long, thickened and dystrophic with subungual debris, consistent with onychomycosis, 1-5 bilateral. No signs of infection noted. Vasc  DP and PT pedal pulses palpable bilaterally. Temperature gradient within normal limits.  Neuro Epicritic and protective threshold sensation grossly intact bilaterally.  Musculoskeletal Exam No symptomatic pedal deformities noted bilateral. Muscular strength within normal limits.  Hammertoe contracture noted right 2nd digit  ASSESSMENT 1. Onychodystrophic nails 1-5 bilateral with hyperkeratosis of nails.  2. Onychomycosis of nail due to dermatophyte bilateral 3. Pain in foot bilateral 4.  Hammertoe right 2nd digit  PLAN OF CARE 1. Patient evaluated today.  2. Instructed to maintain good pedal hygiene and foot care.  3. Mechanical debridement of nails 1-5 bilaterally performed using a nail nipper. Filed with dremel without incident.  4.  Recommend soft supportive shoes that do not irritate the hammertoe deformity  5.  Return to  clinic as needed   Edrick Kins, DPM [facility]  Dr. Edrick Kins, [address]                                        [place], [place] [address]                Office [phone]  Fax [phone]

## 2020-01-13 ENCOUNTER — Ambulatory Visit: Payer: Medicare HMO | Admitting: Dermatology

## 2020-01-13 ENCOUNTER — Other Ambulatory Visit: Payer: Self-pay

## 2020-01-13 DIAGNOSIS — L720 Epidermal cyst: Secondary | ICD-10-CM | POA: Diagnosis not present

## 2020-01-13 DIAGNOSIS — L820 Inflamed seborrheic keratosis: Secondary | ICD-10-CM | POA: Diagnosis not present

## 2020-01-13 DIAGNOSIS — L570 Actinic keratosis: Secondary | ICD-10-CM

## 2020-01-13 DIAGNOSIS — L219 Seborrheic dermatitis, unspecified: Secondary | ICD-10-CM

## 2020-01-13 DIAGNOSIS — L578 Other skin changes due to chronic exposure to nonionizing radiation: Secondary | ICD-10-CM

## 2020-01-13 DIAGNOSIS — L814 Other melanin hyperpigmentation: Secondary | ICD-10-CM

## 2020-01-13 DIAGNOSIS — L821 Other seborrheic keratosis: Secondary | ICD-10-CM | POA: Diagnosis not present

## 2020-01-13 NOTE — Patient Instructions (Addendum)
Cryotherapy Aftercare   Wash gently with soap and water everyday.    Apply Vaseline and Band-Aid daily until healed.   Hydrocortisone cream - Apply to nasal creases as needed for flaking. (Seborrheic Dermatitis)

## 2020-01-13 NOTE — Progress Notes (Signed)
   Follow-Up Visit   Subjective  Victoria Marquez is a 87 y.o. female who presents for the following: Follow-up (History of AKs. No new spots noticed.).   The following portions of the chart were reviewed this encounter and updated as appropriate:       Review of Systems:  No other skin or systemic complaints except as noted in HPI or Assessment and Plan.  Objective  Well appearing patient in no apparent distress; mood and affect are within normal limits.  A focused examination was performed including face, chest, arms. Relevant physical exam findings are noted in the Assessment and Plan.  Objective  L nasal tip x 1, R lower nasal dorsum x 1, R upper sternum x 1, L hand dorsum x 1 (4): Erythematous thin papules/macules with gritty scale.   Objective  Bil alar creases: Pink patches with greasy scale.   Objective  Left Upper Arm x 1: Erythematous keratotic or waxy stuck-on papule    Assessment & Plan   Actinic Damage - chronic, secondary to cumulative UV radiation exposure/sun exposure over time - diffuse scaly erythematous macules with underlying dyspigmentation - Recommend daily broad spectrum sunscreen SPF 30+ to sun-exposed areas, reapply every 2 hours as needed.  - Call for new or changing lesions. Seborrheic Keratoses - Stuck-on, waxy, tan-brown papules and plaques  - Discussed benign etiology and prognosis. - Observe - Call for any changes  AK (actinic keratosis) (4) L nasal tip x 1, R lower nasal dorsum x 1, R upper sternum x 1, L hand dorsum x 1  Recheck left nasal tip on follow-up.  Destruction of lesion - L nasal tip x 1, R lower nasal dorsum x 1, R upper sternum x 1, L hand dorsum x 1  Destruction method: cryotherapy   Informed consent: discussed and consent obtained   Lesion destroyed using liquid nitrogen: Yes   Region frozen until ice ball extended beyond lesion: Yes   Outcome: patient tolerated procedure well with no complications   Post-procedure  details: wound care instructions given    Seborrheic dermatitis Bil alar creases  Recommend OTC Cortisone prn.  Inflamed seborrheic keratosis Left Upper Arm x 1  Destruction of lesion - Left Upper Arm x 1  Destruction method: cryotherapy   Informed consent: discussed and consent obtained   Lesion destroyed using liquid nitrogen: Yes   Region frozen until ice ball extended beyond lesion: Yes   Outcome: patient tolerated procedure well with no complications   Post-procedure details: wound care instructions given     [place] - tiny firm white papules of the L nasal dorsum - type of cyst - benign - may be extracted if symptomatic - observe  Lentigines - Scattered tan macules - Discussed due to sun exposure - Benign, observe - Recommend daily broad spectrum sunscreen SPF 30+ to sun-exposed areas, reapply every 2 hours as needed. - Call for any changes   Return in about 6 months (around 07/12/2020) for TBSE, recheck left nasal tip.  ICherlyn Labella, CMA, am acting as scribe for Willeen Niece, MD .  Documentation: I have reviewed the above documentation for accuracy and completeness, and I agree with the above.  Willeen Niece MD

## 2020-06-02 ENCOUNTER — Ambulatory Visit: Payer: Medicare HMO | Admitting: Dermatology

## 2020-06-02 ENCOUNTER — Other Ambulatory Visit: Payer: Self-pay

## 2020-06-02 DIAGNOSIS — L239 Allergic contact dermatitis, unspecified cause: Secondary | ICD-10-CM

## 2020-06-02 DIAGNOSIS — D2362 Other benign neoplasm of skin of left upper limb, including shoulder: Secondary | ICD-10-CM

## 2020-06-02 DIAGNOSIS — D239 Other benign neoplasm of skin, unspecified: Secondary | ICD-10-CM

## 2020-06-02 MED ORDER — MOMETASONE FUROATE 0.1 % EX CREA: 1.0000 "application " | TOPICAL_CREAM | CUTANEOUS | 0 refills | Status: DC

## 2020-06-02 NOTE — Progress Notes (Signed)
   Follow-Up Visit   Subjective  Victoria Marquez is a 87 y.o. female who presents for the following: rash (Face, neck, hands,  ~2wks, itchy, using eucerin lotion) and check spot (L tricep, hx of LN2 in past, itchy prn).  The following portions of the chart were reviewed this encounter and updated as appropriate:   Tobacco  Allergies  Meds  Problems  Med Hx  Surg Hx  Fam Hx     Review of Systems:  No other skin or systemic complaints except as noted in HPI or Assessment and Plan.  Objective  Well appearing patient in no apparent distress; mood and affect are within normal limits.  A focused examination was performed including face, neck, hands, L arm. Relevant physical exam findings are noted in the Assessment and Plan.  Objective  Left medial tricep: Pink flat pap 1.0cm  Images    Objective  face, hands: Xerosis and pinkness face, dorsum hands   Assessment & Plan  Dermatofibroma Left medial tricep Vs Scar Tissue from previous ISK treatment 01/31/20 Benign-appearing.  Observation.  Call clinic for new or changing lesions.  Recommend daily use of broad spectrum spf 30+ sunscreen to sun-exposed areas.  recheck on f/u  Allergic contact dermatitis, unspecified trigger face, hands Unknown etiology Improving but still persistent Start Mometasone cr bid aa rash up to 2 weeks face, hands mometasone (ELOCON) 0.1 % cream - face, hands  Return for as scheduled with Dr. Nicole Kindred, recheck Dermatofibroma.  I, Othelia Pulling, RMA, am acting as scribe for Sarina Ser, MD .  Documentation: I have reviewed the above documentation for accuracy and completeness, and I agree with the above.  Sarina Ser, MD

## 2020-06-02 NOTE — Patient Instructions (Signed)

## 2020-06-05 ENCOUNTER — Encounter: Payer: Self-pay | Admitting: Dermatology

## 2020-07-06 ENCOUNTER — Other Ambulatory Visit: Payer: Self-pay

## 2020-07-06 ENCOUNTER — Ambulatory Visit (INDEPENDENT_AMBULATORY_CARE_PROVIDER_SITE_OTHER): Payer: Medicare HMO | Admitting: Dermatology

## 2020-07-06 DIAGNOSIS — L570 Actinic keratosis: Secondary | ICD-10-CM

## 2020-07-06 DIAGNOSIS — L578 Other skin changes due to chronic exposure to nonionizing radiation: Secondary | ICD-10-CM

## 2020-07-06 NOTE — Progress Notes (Signed)
   Follow-Up Visit   Subjective  Victoria Marquez is a 87 y.o. female who presents for the following: Follow-up (Patient here for follow up on let nasal tip also has some rough spots on forehead. ).   The following portions of the chart were reviewed this encounter and updated as appropriate:         Objective  Well appearing patient in no apparent distress; mood and affect are within normal limits.  A focused examination was performed including face. Relevant physical exam findings are noted in the Assessment and Plan.  right upper temple x 1, right nasal tip x 1, lower nasal dorsum x 1 (3) Erythematous thin papules/macules with gritty scale.   Clear - left nasal tip  Assessment & Plan  AK (actinic keratosis) (3) right upper temple x 1, right nasal tip x 1, lower nasal dorsum x 1  Actinic keratoses are precancerous spots that appear secondary to cumulative UV radiation exposure/sun exposure over time. They are chronic with expected duration over 1 year. A portion of actinic keratoses will progress to squamous cell carcinoma of the skin. It is not possible to reliably predict which spots will progress to skin cancer and so treatment is recommended to prevent development of skin cancer.  Recommend daily broad spectrum sunscreen SPF 30+ to sun-exposed areas, reapply every 2 hours as needed.  Recommend staying in the shade or wearing long sleeves, sun glasses (UVA+UVB protection) and wide brim hats (4-inch brim around the entire circumference of the hat). Call for new or changing lesions.  Destruction of lesion - right upper temple x 1, right nasal tip x 1, lower nasal dorsum x 1  Destruction method: cryotherapy   Informed consent: discussed and consent obtained   Lesion destroyed using liquid nitrogen: Yes   Region frozen until ice ball extended beyond lesion: Yes   Outcome: patient tolerated procedure well with no complications   Post-procedure details: wound care instructions  given   Additional details:  Prior to procedure, discussed risks of blister formation, small wound, skin dyspigmentation, or rare scar following cryotherapy. Recommend Vaseline ointment to treated areas while healing.   Actinic Damage - chronic, secondary to cumulative UV radiation exposure/sun exposure over time - diffuse scaly erythematous macules with underlying dyspigmentation - Recommend daily broad spectrum sunscreen SPF 30+ to sun-exposed areas, reapply every 2 hours as needed.  - Recommend staying in the shade or wearing long sleeves, sun glasses (UVA+UVB protection) and wide brim hats (4-inch brim around the entire circumference of the hat). - Call for new or changing lesions.  Return in about 6 months (around 01/05/2021) for tbse . I, Ruthell Rummage, CMA, am acting as scribe for Brendolyn Patty, MD.  Documentation: I have reviewed the above documentation for accuracy and completeness, and I agree with the above.  Brendolyn Patty MD

## 2020-07-06 NOTE — Patient Instructions (Signed)
Actinic keratoses are precancerous spots that appear secondary to cumulative UV radiation exposure/sun exposure over time. They are chronic with expected duration over 1 year. A portion of actinic keratoses will progress to squamous cell carcinoma of the skin. It is not possible to reliably predict which spots will progress to skin cancer and so treatment is recommended to prevent development of skin cancer.  Recommend daily broad spectrum sunscreen SPF 30+ to sun-exposed areas, reapply every 2 hours as needed.  Recommend staying in the shade or wearing long sleeves, sun glasses (UVA+UVB protection) and wide brim hats (4-inch brim around the entire circumference of the hat). Call for new or changing lesions.   Cryotherapy Aftercare  Wash gently with soap and water everyday.   Apply Vaseline and Band-Aid daily until healed.   Melanoma ABCDEs  Melanoma is the most dangerous type of skin cancer, and is the leading cause of death from skin disease.  You are more likely to develop melanoma if you: Have light-colored skin, light-colored eyes, or red or blond hair Spend a lot of time in the sun Tan regularly, either outdoors or in a tanning bed Have had blistering sunburns, especially during childhood Have a close family member who has had a melanoma Have atypical moles or large birthmarks  Early detection of melanoma is key since treatment is typically straightforward and cure rates are extremely high if we catch it early.   The first sign of melanoma is often a change in a mole or a new dark spot.  The ABCDE system is a way of remembering the signs of melanoma.  A for asymmetry:  The two halves do not match. B for border:  The edges of the growth are irregular. C for color:  A mixture of colors are present instead of an even brown color. D for diameter:  Melanomas are usually (but not always) greater than 45mm - the size of a pencil eraser. E for evolution:  The spot keeps changing in size,  shape, and color.  Please check your skin once per month between visits. You can use a small mirror in front and a large mirror behind you to keep an eye on the back side or your body.   If you see any new or changing lesions before your next follow-up, please call to schedule a visit.  Please continue daily skin protection including broad spectrum sunscreen SPF 30+ to sun-exposed areas, reapplying every 2 hours as needed when you're outdoors.   Staying in the shade or wearing long sleeves, sun glasses (UVA+UVB protection) and wide brim hats (4-inch brim around the entire circumference of the hat) are also recommended for sun protection.    If you have any questions or concerns for your doctor, please call our main line at [phone] and press option 4 to reach your doctor's medical assistant. If no one answers, please leave a voicemail as directed and we will return your call as soon as possible. Messages left after 4 pm will be answered the following business day.   You may also send [place] a message via [place]. We typically respond to MyChart messages within 1-2 business days.  For prescription refills, please ask your pharmacy to contact our office. Our fax number is [phone].  If you have an urgent issue when the clinic is closed that cannot wait until the next business day, you can page your doctor at the number below.    Please note that while we do our best to be  available for urgent issues outside of office hours, we are not available 24/7.   If you have an urgent issue and are unable to reach [place], you may choose to seek medical care at your doctor's office, retail clinic, urgent care center, or emergency room.  If you have a medical emergency, please immediately call 911 or go to the emergency department.  Pager Numbers  - Dr. Nehemiah Massed: [phone]  - Dr. Laurence Ferrari: [phone]  - Dr. Nicole Kindred: [phone]  In the event of inclement weather, please call our main line at  [phone] for an update on the status of any delays or closures.  Dermatology Medication Tips: Please keep the boxes that topical medications come in in order to help keep track of the instructions about where and how to use these. Pharmacies typically print the medication instructions only on the boxes and not directly on the medication tubes.   If your medication is too expensive, please contact our office at [phone] option 4 or send [place] a message through [place].   We are unable to tell what your co-pay for medications will be in advance as this is different depending on your insurance coverage. However, we may be able to find a substitute medication at lower cost or fill out paperwork to get insurance to cover a needed medication.   If a prior authorization is required to get your medication covered by your insurance company, please allow [place] 1-2 business days to complete this process.  Drug prices often vary depending on where the prescription is filled and some pharmacies may offer cheaper prices.  The website www.goodrx.com contains coupons for medications through different pharmacies. The prices here do not account for what the cost may be with help from insurance (it may be cheaper with your insurance), but the website can give you the price if you did not use any insurance.  - You can print the associated coupon and take it with your prescription to the pharmacy.  - You may also stop by our office during regular business hours and pick up a GoodRx coupon card.  - If you need your prescription sent electronically to a different pharmacy, notify our office through [facility] or by phone at [phone] option 4.

## 2020-09-29 ENCOUNTER — Other Ambulatory Visit: Payer: Self-pay | Admitting: Internal Medicine

## 2020-09-29 DIAGNOSIS — M7989 Other specified soft tissue disorders: Secondary | ICD-10-CM

## 2020-10-05 ENCOUNTER — Ambulatory Visit
Admission: RE | Admit: 2020-10-05 | Discharge: 2020-10-05 | Disposition: A | Discharge status: Home / Self Care | Payer: Medicare HMO | Source: Ambulatory Visit | Attending: Internal Medicine | Admitting: Internal Medicine

## 2020-10-05 ENCOUNTER — Other Ambulatory Visit: Payer: Self-pay

## 2020-10-05 DIAGNOSIS — M7989 Other specified soft tissue disorders: Secondary | ICD-10-CM | POA: Insufficient documentation

## 2021-01-25 ENCOUNTER — Ambulatory Visit: Payer: Medicare HMO | Admitting: Dermatology

## 2021-01-25 ENCOUNTER — Other Ambulatory Visit: Payer: Self-pay

## 2021-01-25 DIAGNOSIS — S80811A Abrasion, right lower leg, initial encounter: Secondary | ICD-10-CM | POA: Diagnosis not present

## 2021-01-25 DIAGNOSIS — L578 Other skin changes due to chronic exposure to nonionizing radiation: Secondary | ICD-10-CM

## 2021-01-25 DIAGNOSIS — Z1283 Encounter for screening for malignant neoplasm of skin: Secondary | ICD-10-CM

## 2021-01-25 DIAGNOSIS — D229 Melanocytic nevi, unspecified: Secondary | ICD-10-CM

## 2021-01-25 DIAGNOSIS — L820 Inflamed seborrheic keratosis: Secondary | ICD-10-CM | POA: Diagnosis not present

## 2021-01-25 DIAGNOSIS — L814 Other melanin hyperpigmentation: Secondary | ICD-10-CM

## 2021-01-25 DIAGNOSIS — T148XXA Other injury of unspecified body region, initial encounter: Secondary | ICD-10-CM

## 2021-01-25 DIAGNOSIS — L853 Xerosis cutis: Secondary | ICD-10-CM

## 2021-01-25 DIAGNOSIS — L821 Other seborrheic keratosis: Secondary | ICD-10-CM

## 2021-01-25 DIAGNOSIS — D1800 Hemangioma unspecified site: Secondary | ICD-10-CM

## 2021-01-25 NOTE — Patient Instructions (Addendum)
Recommend Cetaphil Rough And Bumpy or Amlactin Rapid Relief to help soften and remove scale.   Gentle Skin Care Guide  1. Bathe no more than once a day.  2. Avoid bathing in hot water  3. Use a mild soap like Dove, Vanicream, Cetaphil, CeraVe. Can use Lever 2000 or Cetaphil antibacterial soap  4. Use soap only where you need it. On most days, use it under your arms, between your legs, and on your feet. Let the water rinse other areas unless visibly dirty.  5. When you get out of the bath/shower, use a towel to gently blot your skin dry, don't rub it.  6. While your skin is still a little damp, apply a moisturizing cream such as Vanicream, CeraVe, Cetaphil, Eucerin, Sarna lotion or plain Vaseline Jelly. For hands apply Neutrogena [place] Hand Cream or Excipial Hand Cream.  7. Reapply moisturizer any time you start to itch or feel dry.  8. Sometimes using free and clear laundry detergents can be helpful. Fabric softener sheets should be avoided. Downy Free & Gentle liquid, or any liquid fabric softener that is free of dyes and perfumes, it acceptable to use  9. If your doctor has given you prescription creams you may apply moisturizers over them   Actinic keratoses are precancerous spots that appear secondary to cumulative UV radiation exposure/sun exposure over time. They are chronic with expected duration over 1 year. A portion of actinic keratoses will progress to squamous cell carcinoma of the skin. It is not possible to reliably predict which spots will progress to skin cancer and so treatment is recommended to prevent development of skin cancer.  Recommend daily broad spectrum sunscreen SPF 30+ to sun-exposed areas, reapply every 2 hours as needed.  Recommend staying in the shade or wearing long sleeves, sun glasses (UVA+UVB protection) and wide brim hats (4-inch brim around the entire circumference of the hat). Call for new or changing lesions.   Cryotherapy Aftercare  Wash  gently with soap and water everyday.   Apply Vaseline and Band-Aid daily until healed.   Seborrheic Keratosis  What causes seborrheic keratoses? Seborrheic keratoses are harmless, common skin growths that first appear during adult life.  As time goes by, more growths appear.  Some people may develop a large number of them.  Seborrheic keratoses appear on both covered and uncovered body parts.  They are not caused by sunlight.  The tendency to develop seborrheic keratoses can be inherited.  They vary in color from skin-colored to gray, brown, or even black.  They can be either smooth or have a rough, warty surface.   Seborrheic keratoses are superficial and look as if they were stuck on the skin.  Under the microscope this type of keratosis looks like layers upon layers of skin.  That is why at times the top layer may seem to fall off, but the rest of the growth remains and re-grows.    Treatment Seborrheic keratoses do not need to be treated, but can easily be removed in the office.  Seborrheic keratoses often cause symptoms when they rub on clothing or jewelry.  Lesions can be in the way of shaving.  If they become inflamed, they can cause itching, soreness, or burning.  Removal of a seborrheic keratosis can be accomplished by freezing, burning, or surgery. If any spot bleeds, scabs, or grows rapidly, please return to have it checked, as these can be an indication of a skin cancer.    Melanoma ABCDEs  Melanoma is  the most dangerous type of skin cancer, and is the leading cause of death from skin disease.  You are more likely to develop melanoma if you: Have light-colored skin, light-colored eyes, or red or blond hair Spend a lot of time in the sun Tan regularly, either outdoors or in a tanning bed Have had blistering sunburns, especially during childhood Have a close family member who has had a melanoma Have atypical moles or large birthmarks  Early detection of melanoma is key since  treatment is typically straightforward and cure rates are extremely high if we catch it early.   The first sign of melanoma is often a change in a mole or a new dark spot.  The ABCDE system is a way of remembering the signs of melanoma.  A for asymmetry:  The two halves do not match. B for border:  The edges of the growth are irregular. C for color:  A mixture of colors are present instead of an even brown color. D for diameter:  Melanomas are usually (but not always) greater than 71mm - the size of a pencil eraser. E for evolution:  The spot keeps changing in size, shape, and color.  Please check your skin once per month between visits. You can use a small mirror in front and a large mirror behind you to keep an eye on the back side or your body.   If you see any new or changing lesions before your next follow-up, please call to schedule a visit.  Please continue daily skin protection including broad spectrum sunscreen SPF 30+ to sun-exposed areas, reapplying every 2 hours as needed when you're outdoors.   Staying in the shade or wearing long sleeves, sun glasses (UVA+UVB protection) and wide brim hats (4-inch brim around the entire circumference of the hat) are also recommended for sun protection.    If You Need Anything After Your Visit  If you have any questions or concerns for your doctor, please call our main line at [phone] and press option 4 to reach your doctor's medical assistant. If no one answers, please leave a voicemail as directed and we will return your call as soon as possible. Messages left after 4 pm will be answered the following business day.   You may also send [place] a message via [place]. We typically respond to MyChart messages within 1-2 business days.  For prescription refills, please ask your pharmacy to contact our office. Our fax number is [phone].  If you have an urgent issue when the clinic is closed that cannot wait until the next business day, you can  page your doctor at the number below.    Please note that while we do our best to be available for urgent issues outside of office hours, we are not available 24/7.   If you have an urgent issue and are unable to reach [place], you may choose to seek medical care at your doctor's office, retail clinic, urgent care center, or emergency room.  If you have a medical emergency, please immediately call 911 or go to the emergency department.  Pager Numbers  - Dr. Nehemiah Massed: [phone]  - Dr. Laurence Ferrari: [phone]  - Dr. Nicole Kindred: [phone]  In the event of inclement weather, please call our main line at [phone] for an update on the status of any delays or closures.  Dermatology Medication Tips: Please keep the boxes that topical medications come in in order to help keep track of the instructions about where and how to  use these. Pharmacies typically print the medication instructions only on the boxes and not directly on the medication tubes.   If your medication is too expensive, please contact our office at [phone] option 4 or send [place] a message through [place].   We are unable to tell what your co-pay for medications will be in advance as this is different depending on your insurance coverage. However, we may be able to find a substitute medication at lower cost or fill out paperwork to get insurance to cover a needed medication.   If a prior authorization is required to get your medication covered by your insurance company, please allow [place] 1-2 business days to complete this process.  Drug prices often vary depending on where the prescription is filled and some pharmacies may offer cheaper prices.  The website www.goodrx.com contains coupons for medications through different pharmacies. The prices here do not account for what the cost may be with help from insurance (it may be cheaper with your insurance), but the website can give you the price if you did not use any insurance.  - You  can print the associated coupon and take it with your prescription to the pharmacy.  - You may also stop by our office during regular business hours and pick up a GoodRx coupon card.  - If you need your prescription sent electronically to a different pharmacy, notify our office through [facility] or by phone at [phone] option 4.     Si Usted Necesita Algo Despus de Su Visita  Tambin puede enviarnos un mensaje a travs de Pharmacist, community. Por lo general respondemos a los mensajes de MyChart en el transcurso de 1 a 2 das hbiles.  Para renovar recetas, por favor pida a su farmacia que se ponga en contacto con nuestra oficina. Harland Dingwall de fax es [place] [phone].  Si tiene un asunto urgente cuando la clnica est cerrada y que no puede esperar hasta el siguiente da hbil, puede llamar/localizar a su doctor(a) al nmero que aparece a continuacin.   Por favor, tenga en cuenta que aunque hacemos todo lo posible para estar disponibles para asuntos urgentes fuera del horario de [place], no estamos disponibles las 24 horas del da, los 7 das de la [place].   Si tiene un problema urgente y no puede comunicarse con nosotros, puede optar por buscar atencin mdica  en el consultorio de su doctor(a), en una clnica privada, en un centro de atencin urgente o en una sala de emergencias.  Si tiene Engineering geologist, por favor llame inmediatamente al 911 o vaya a la sala de emergencias.  Nmeros de bper  - Dr. Nehemiah Massed: [phone]  - Dra. Moye: [phone]  - Dra. Nicole Kindred: [phone]  En caso de inclemencias del [place], por favor llame a Johnsie Kindred principal al [phone] para una actualizacin sobre el [place] de cualquier retraso o cierre.  Consejos para la medicacin en dermatologa: Por favor, guarde las cajas en las que vienen los medicamentos de uso tpico para ayudarle a seguir las instrucciones sobre dnde y cmo usarlos. Las farmacias generalmente imprimen las  instrucciones del medicamento slo en las cajas y no directamente en los tubos del [place].   Si su medicamento es muy caro, por favor, pngase en contacto con Zigmund Daniel llamando al [phone] y presione la opcin 4 o envenos un mensaje a travs de Pharmacist, community.   No podemos decirle cul ser su copago por los medicamentos por adelantado ya que esto es diferente dependiendo de  la cobertura de su seguro. Sin embargo, es posible que podamos encontrar un medicamento sustituto a Electrical engineer un formulario para que el seguro cubra el medicamento que se considera necesario.   Si se requiere una autorizacin previa para que su compaa de seguros [place] su medicamento, por favor permtanos de 1 a 2 das hbiles para completar este proceso.  Los precios de los medicamentos varan con frecuencia dependiendo del Environmental consultant de dnde se surte la receta y alguna farmacias pueden ofrecer precios ms baratos.  El sitio web www.goodrx.com tiene cupones para medicamentos de Airline pilot. Los precios aqu no tienen en cuenta lo que podra costar con la ayuda del seguro (puede ser ms barato con su seguro), pero el sitio web puede darle el precio si no utiliz Research scientist (physical sciences).  - Puede imprimir el cupn correspondiente y llevarlo con su receta a la farmacia.  - Tambin puede pasar por nuestra oficina durante el horario de atencin regular y Charity fundraiser una tarjeta de cupones de GoodRx.  - Si necesita que su receta se enve electrnicamente a una farmacia diferente, informe a nuestra oficina a travs de MyChart de [place] o por telfono llamando al [phone] y presione la opcin 4.

## 2021-01-25 NOTE — Progress Notes (Addendum)
Follow-Up Visit   Subjective  Victoria Marquez is a 88 y.o. female who presents for the following: Follow-up (Patient here today for 6 month tbse. Patient reports some spots at face she would like checked. ).  Spots are hard, scaly and she tends to pick at them.  The patient presents for Total-Body Skin Exam (TBSE) for skin cancer screening and mole check.  The patient has spots, moles and lesions to be evaluated, some may be new or changing and the patient has concerns that these could be cancer.   The following portions of the chart were reviewed this encounter and updated as appropriate:      Review of Systems: No other skin or systemic complaints except as noted in HPI or Assessment and Plan.   Objective  Well appearing patient in no apparent distress; mood and affect are within normal limits.  A full examination was performed including scalp, head, eyes, ears, nose, lips, neck, chest, axillae, abdomen, back, buttocks, bilateral upper extremities, bilateral lower extremities, hands, feet, fingers, toes, fingernails, and toenails. All findings within normal limits unless otherwise noted below.  right forehead x 1, left upper eyebrow x 1, left upper paranasal x 1,  left cheek x 1 (4) Hyperkeratotic papules at right forehead, left upper eyebrow, left upper paranasal, left mid back, right upper back, upper arms, chest, and left cheek, legs     posterior neck x 1 Irregular brown stuck-on, waxy papule  right medial calf Pink excoriated macule    Assessment & Plan  Inflamed seborrheic keratosis (5) right forehead x 1, left upper eyebrow x 1, left upper paranasal x 1,  left cheek x 1 (4); posterior neck x 1  Isk vs ak at right forehead, left upper eyebrow, left upper paranasal, left mid back, right upper back, upper arms, chest, and left cheek   Will continue to monitor additional ISKs arms, legs, back, chest, recommend cream as below  Recommend Cetaphil Rough And Bumpy or  Amlactin Rapid Relief to help soften and remove scale.       Destruction of lesion - posterior neck x 1, right forehead x 1, left upper eyebrow x 1, left upper paranasal x 1,  left cheek x 1  Destruction method: cryotherapy   Informed consent: discussed and consent obtained   Lesion destroyed using liquid nitrogen: Yes   Region frozen until ice ball extended beyond lesion: Yes   Outcome: patient tolerated procedure well with no complications   Post-procedure details: wound care instructions given   Additional details:  Prior to procedure, discussed risks of blister formation, small wound, skin dyspigmentation, or rare scar following cryotherapy. Recommend Vaseline ointment to treated areas while healing.   Excoriation right medial calf  Healing Benign, observe.    Lentigines - Scattered tan macules - Due to sun exposure - Benign-appearing, observe - Recommend daily broad spectrum sunscreen SPF 30+ to sun-exposed areas, reapply every 2 hours as needed. - Call for any changes  Seborrheic Keratoses - Stuck-on, waxy, tan-brown papules and/or plaques  - Benign-appearing - Discussed benign etiology and prognosis. - Observe - Call for any changes  Melanocytic Nevi - Tan-brown and/or pink-flesh-colored symmetric macules and papules - Benign appearing on exam today - Observation - Call clinic for new or changing moles - Recommend daily use of broad spectrum spf 30+ sunscreen to sun-exposed areas.   Xerosis - diffuse xerotic patches - recommend gentle, hydrating skin care - gentle skin care handout given  Hemangiomas - Red papules -  Discussed benign nature - Observe - Call for any changes  Actinic Damage - Chronic condition, secondary to cumulative UV/sun exposure - diffuse scaly erythematous macules with underlying dyspigmentation - Recommend daily broad spectrum sunscreen SPF 30+ to sun-exposed areas, reapply every 2 hours as needed.  - Staying in the shade or  wearing long sleeves, sun glasses (UVA+UVB protection) and wide brim hats (4-inch brim around the entire circumference of the hat) are also recommended for sun protection.  - Call for new or changing lesions.  Skin cancer screening performed today. Return for 6 month ISK follow up. I, Ruthell Rummage, CMA, am acting as scribe for Brendolyn Patty, MD.  Documentation: I have reviewed the above documentation for accuracy and completeness, and I agree with the above.  Brendolyn Patty MD

## 2021-03-21 ENCOUNTER — Ambulatory Visit
Admission: RE | Admit: 2021-03-21 | Discharge: 2021-03-21 | Disposition: A | Discharge status: Home / Self Care | Payer: Medicare HMO | Source: Ambulatory Visit | Attending: Physician Assistant | Admitting: Physician Assistant

## 2021-03-21 ENCOUNTER — Other Ambulatory Visit (HOSPITAL_COMMUNITY): Payer: Self-pay | Admitting: Physician Assistant

## 2021-03-21 ENCOUNTER — Other Ambulatory Visit: Payer: Self-pay

## 2021-03-21 ENCOUNTER — Other Ambulatory Visit: Payer: Self-pay | Admitting: Physician Assistant

## 2021-03-21 DIAGNOSIS — R55 Syncope and collapse: Secondary | ICD-10-CM | POA: Diagnosis present

## 2021-06-04 ENCOUNTER — Encounter: Payer: Self-pay | Admitting: Emergency Medicine

## 2021-06-04 ENCOUNTER — Ambulatory Visit
Admission: EM | Admit: 2021-06-04 | Discharge: 2021-06-04 | Disposition: A | Discharge status: Home / Self Care | Payer: Medicare HMO

## 2021-06-04 DIAGNOSIS — L309 Dermatitis, unspecified: Secondary | ICD-10-CM

## 2021-06-04 DIAGNOSIS — L219 Seborrheic dermatitis, unspecified: Secondary | ICD-10-CM

## 2021-06-04 MED ORDER — CETIRIZINE HCL 5 MG PO TABS: 5.0000 mg | ORAL_TABLET | Freq: Every day | ORAL | 0 refills | Status: DC

## 2021-06-04 MED ORDER — TRIAMCINOLONE ACETONIDE 0.025 % EX OINT: 1.0000 "application " | TOPICAL_OINTMENT | Freq: Two times a day (BID) | CUTANEOUS | 0 refills | Status: AC

## 2021-06-04 NOTE — ED Provider Notes (Signed)
Victoria Marquez    CSN: [id number] Arrival date & time: 06/04/21  1239      History   Chief Complaint Chief Complaint  Patient presents with   Rash    HPI Victoria Marquez is a 88 y.o. female.   HPI Patient presents today for evaluation of multiple raised red bumps erupting around on chin and extending around the mouth x 2 days. The bumps are non-itching, however new bumps have developed today.  Patient was seen by her PCP at her facility one day ago and who referred her to Dermatology and has an appointment in 3 days. She became concern as she noticed a new bump today. She doesn't recall eating any new foods. She did eat freshly picked strawberries the day before the bumps developed. This is a new problem and has never occurred previous. She also has a painless indurated rash right face proximal to the right ear. Her PCP was concerned about the skin lesion and has referred her for biopsy. Denies any other changes in medications, cosmetics, lotions, or foods that she can attribute to these acute skin eruptions. Denies any recent illness. She is accompanied by her son today. Past Medical History:  Diagnosis Date   Actinic keratosis    Anxiety    Cancer ([place])    Colon polyps    Depression    Hyperglycemia    Hyperlipemia    Osteopenia    Urinary incontinence    Vitamin D deficiency     Patient Active Problem List   Diagnosis Date Noted   Abnormal ankle brachial index 07/01/2019   Bilateral hand numbness 12/21/2016   History of gynecologic surgery 12/06/2016   Mixed incontinence 12/06/2016   Urinary incontinence with continuous leakage 08/28/2016   Blood glucose elevated 07/22/2015   HLD (hyperlipidemia) 07/22/2015   BP (high blood pressure) 07/22/2015   Osteopenia 07/22/2015   Avitaminosis D 07/22/2015    Past Surgical History:  Procedure Laterality Date   BLADDER SURGERY     EYE SURGERY      OB History   No obstetric history on file.      Home  Medications    Prior to Admission medications   Medication Sig Start Date End Date Taking? Authorizing Provider  cetirizine (ZYRTEC) 5 MG tablet Take 1 tablet (5 mg total) by mouth at bedtime for 5 days. 06/04/21 06/09/21 Yes Scot Jun, FNP  triamcinolone (KENALOG) 0.025 % ointment Apply 1 application. topically 2 (two) times daily for 5 days. 06/04/21 06/09/21 Yes Scot Jun, FNP  alendronate (FOSAMAX) 70 MG tablet  03/22/13   [provider]  amLODipine (NORVASC) 5 MG tablet TAKE 1 TABLET (5 MG TOTAL) BY MOUTH ONCE DAILY. 07/02/15   [provider]  amoxicillin (AMOXIL) 875 MG tablet Take by mouth. 06/03/21   [provider]  azelastine (ASTELIN) 0.1 % nasal spray Place into the nose. 10/08/14 01/25/21  [provider]  Calcium Carbonate-Vitamin D 600-200 MG-UNIT TABS Take by mouth.    [provider]  Cholecalciferol 25 MCG (1000 UT) tablet Take 2,000 Units by mouth daily.    [provider]  fluticasone (FLONASE) 50 MCG/ACT nasal spray Place into the nose.    [provider]  Folic Acid 20 MG CAPS Take by mouth.    [provider]  Influenza vac split quadrivalent PF (FLUZONE HIGH-DOSE) 0.5 ML injection Fluzone High-Dose 2018-2019 (PF) 180 mcg/0.5 mL intramuscular syringe  TO BE ADMINISTERED BY PHARMACIST FOR IMMUNIZATION  Patient not taking: No sig reported    [provider]  losartan (COZAAR) 100 MG tablet Take 100 mg by mouth daily. 03/22/13   [provider]  Omega-3 Fatty Acids (FISH OIL) 1000 MG CAPS Take by mouth.    [provider]  pantoprazole (PROTONIX) 40 MG tablet  03/10/13   [provider]  Trospium Chloride 60 MG CP24 trospium ER 60 mg capsule,extended release 24 hr 04/03/17   [provider]  Zoster Vaccine Adjuvanted [facility]) injection Shingrix (PF) 50 mcg/0.5 mL intramuscular suspension, kit Patient not taking: No sig reported    [provider]    Family History No family history on file.  Social History Social History   Tobacco Use   Smoking status: Former    Types: Cigarettes    Quit date: 07/22/1990    Years since quitting: 30.8   Smokeless tobacco: Never  Vaping Use   Vaping Use: Never used  Substance Use Topics   Alcohol use: Yes    Alcohol/week: 0.0 standard drinks   Drug use: No     Allergies   Codeine Review of Systems Review of Systems Pertinent negatives listed in HPI  Physical Exam Triage Vital Signs ED Triage Vitals  Enc Vitals Group     BP 06/04/21 1326 133/85     Pulse Rate 06/04/21 1326 63     Resp 06/04/21 1326 16     Temp 06/04/21 1326 97.8 F (36.6 C)     Temp Source 06/04/21 1326 Oral     SpO2 06/04/21 1326 95 %     Weight --      Height --      Head Circumference --      Peak Flow --      Pain Score 06/04/21 1332 0     Pain Loc --      Pain Edu? --      Excl. in [place]? --    No data found.  Updated Vital Signs BP 133/85 (BP Location: Left Arm)   Pulse 63   Temp 97.8 F (36.6 C) (Oral)   Resp 16   SpO2 95%   Visual Acuity Right Eye Distance:   Left Eye Distance:   Bilateral Distance:    Right Eye Near:   Left Eye Near:    Bilateral Near:     Physical Exam Vitals reviewed.  Constitutional:      Appearance: Normal appearance.  HENT:     Head:   Cardiovascular:     Rate and Rhythm: Normal rate and regular rhythm.  Pulmonary:     Effort: Pulmonary effort is normal.     Breath sounds: Normal breath sounds.  Neurological:     Mental Status: She is alert.     UC Treatments / Results  Labs (all labs ordered are listed, but only abnormal results are displayed) Labs Reviewed - No data to display  EKG   Radiology No results found.  Procedures Procedures (including critical care time)  Medications Ordered in UC Medications - No data to display  Initial Impression / Assessment and Plan / UC Course  I have reviewed the triage vital signs and the  nursing notes.  Pertinent labs & imaging results that were available during my care of the patient were reviewed by me and considered in my medical decision making (see chart for details).    Dermatitis, unknown etiology, trial topical triamcinolone (rash doesn't involve oral mucosa or lips) and cetrizine 5 mg  at bedtime.  Seborrhea face Keep follow-up with dermatology scheduled for Tuesday. Return for evaluation if symptoms worsen or doesn't improve prior to Tuesday.  Final Clinical Impressions(s) / UC Diagnoses   Final diagnoses:  Dermatitis (face proxmial mouth)  Seborrhea of face (right ear)     Discharge Instructions      I am treating the rash around her mouth as a skin reaction of unknown cause.  Apply triamcinolone cream twice daily to the affected area.  Take Zyrtec at bedtime to prevent recurrence. Keep follow-up with the dermatologist Tuesday at [facility] however still follow-up with your dermatologist as I am concerned about the Skin lesion on the right side of your face proximal to your ear and recommend biopsy.     ED Prescriptions     Medication Sig Dispense Auth. Provider   triamcinolone (KENALOG) 0.025 % ointment Apply 1 application. topically 2 (two) times daily for 5 days. 30 g Scot Jun, FNP   cetirizine (ZYRTEC) 5 MG tablet Take 1 tablet (5 mg total) by mouth at bedtime for 5 days. 5 tablet Scot Jun, FNP      PDMP not reviewed this encounter.   Scot Jun, FNP 06/05/21 [phone]

## 2021-06-04 NOTE — ED Triage Notes (Signed)
Pt presents with a rash around her mouth for 2 days

## 2021-06-04 NOTE — Discharge Instructions (Addendum)
I am treating the rash around her mouth as a skin reaction of unknown cause.  Apply triamcinolone cream twice daily to the affected area.  Take Zyrtec at bedtime to prevent recurrence. Keep follow-up with the dermatologist Tuesday at [facility] however still follow-up with your dermatologist as I am concerned about the Skin lesion on the right side of your face proximal to your ear and recommend biopsy.

## 2021-06-07 ENCOUNTER — Telehealth: Payer: Self-pay

## 2021-06-07 ENCOUNTER — Ambulatory Visit: Payer: Medicare HMO | Admitting: Nurse Practitioner

## 2021-06-07 ENCOUNTER — Ambulatory Visit: Payer: Medicare HMO | Admitting: Dermatology

## 2021-06-07 DIAGNOSIS — M792 Neuralgia and neuritis, unspecified: Secondary | ICD-10-CM | POA: Diagnosis not present

## 2021-06-07 DIAGNOSIS — B029 Zoster without complications: Secondary | ICD-10-CM | POA: Diagnosis not present

## 2021-06-07 MED ORDER — VALACYCLOVIR HCL 1 G PO TABS: 1000.0000 mg | ORAL_TABLET | Freq: Three times a day (TID) | ORAL | 0 refills | Status: DC

## 2021-06-07 NOTE — Progress Notes (Signed)
   Follow-Up Visit   Subjective  Victoria Marquez is a 88 y.o. female who presents for the following: Rash (R face, ~6 days, no symptoms, pt had pain that she thought was associated with needing a root canal) and check spots (R pre auricular, nurse at [facility] noticed, no symptoms).  The patient has spots, moles and lesions to be evaluated, some may be new or changing and the patient has concerns that these could be cancer.   The following portions of the chart were reviewed this encounter and updated as appropriate:       Review of Systems:  No other skin or systemic complaints except as noted in HPI or Assessment and Plan.  Objective  Well appearing patient in no apparent distress; mood and affect are within normal limits.  A focused examination was performed including face. Relevant physical exam findings are noted in the Assessment and Plan.  Head - Anterior (Face) Crusted pink violaceous macules with small vesicles/erosions R jaw, preauricular, chin         Assessment & Plan  Herpes zoster without complication Head - Anterior (Face)  With pain/neuralgia  Discussed viral etiology (herpes zoster) and risk of contagion. Before blisters dry up and heal, shingles rash can cause chickenpox in people who have never had it or have never been vaccinated against it, if they are exposed to the virus.  Keep area covered.  Avoid contact with pregnant women. Shingles can also cause significant pain or unusual skin sensations (post-herpetic neuralgia) which may persist after rash has resolved.  Shingles rash may leave dyspigmented scars on skin once healed.  Start Valtrex 1g tid for 7 days #21 0rf Will contact her dentist since she is scheduled for a root canal this week for dental pain.  May actually be due to neuralgia from shingles.  Virus culture - Head - Anterior (Face)  valACYclovir (VALTREX) 1000 MG tablet - Head - Anterior (Face) Take 1 tablet (1,000 mg total) by mouth 3  (three) times daily.  Related Procedures Varicella-zoster by PCR   Return if symptoms worsen or fail to improve.  I, Othelia Pulling, RMA, am acting as scribe for Brendolyn Patty, MD .  Documentation: I have reviewed the above documentation for accuracy and completeness, and I agree with the above.  Brendolyn Patty MD

## 2021-06-07 NOTE — Telephone Encounter (Signed)
Spoke to [place] at [employer] and advised pt was seen in our office today and has been diagnosed with Shingles.  Pt was prescribed Valtrex 1g tid for 7 days.  Advised pt wanted [place] to call [place] to see if she needed to keep her appointment for root canal.  Advised that the pain that patient originally thought was r/t tooth that needed root canal could be related to the Shingles.  Eunice Blase to please call patient and advise her on upcoming appointment for root canal./sh

## 2021-06-07 NOTE — Patient Instructions (Signed)

## 2021-06-10 LAB — VARICELLA-ZOSTER BY PCR: Varicella-Zoster, PCR: POSITIVE — AB

## 2021-06-13 ENCOUNTER — Telehealth: Payer: Self-pay

## 2021-06-13 NOTE — Telephone Encounter (Signed)
-----   Message from Brendolyn Patty, MD sent at 06/13/2021 11:22 AM EDT ----- VZV PCR is positive, confirms diagnosis of shingles   - please call patient

## 2021-06-13 NOTE — Telephone Encounter (Signed)
Advised pt of culture results.  Advised pt I called [place] and let them know culture results./sh

## 2021-06-16 LAB — VIRUS CULTURE

## 2021-08-08 ENCOUNTER — Ambulatory Visit: Payer: Medicare HMO | Admitting: Dermatology

## 2021-08-08 DIAGNOSIS — Z1283 Encounter for screening for malignant neoplasm of skin: Secondary | ICD-10-CM

## 2021-08-08 DIAGNOSIS — L720 Epidermal cyst: Secondary | ICD-10-CM | POA: Diagnosis not present

## 2021-08-08 DIAGNOSIS — D2321 Other benign neoplasm of skin of right ear and external auricular canal: Secondary | ICD-10-CM

## 2021-08-08 DIAGNOSIS — L578 Other skin changes due to chronic exposure to nonionizing radiation: Secondary | ICD-10-CM | POA: Diagnosis not present

## 2021-08-08 DIAGNOSIS — L814 Other melanin hyperpigmentation: Secondary | ICD-10-CM

## 2021-08-08 DIAGNOSIS — D229 Melanocytic nevi, unspecified: Secondary | ICD-10-CM

## 2021-08-08 DIAGNOSIS — L820 Inflamed seborrheic keratosis: Secondary | ICD-10-CM | POA: Diagnosis not present

## 2021-08-08 DIAGNOSIS — L821 Other seborrheic keratosis: Secondary | ICD-10-CM

## 2021-08-08 DIAGNOSIS — D1800 Hemangioma unspecified site: Secondary | ICD-10-CM

## 2021-08-08 DIAGNOSIS — D239 Other benign neoplasm of skin, unspecified: Secondary | ICD-10-CM

## 2021-08-08 NOTE — Patient Instructions (Addendum)
Cryotherapy Aftercare  Wash gently with soap and water everyday.   Apply Vaseline and Band-Aid daily until healed.   Recommend OTC Gold Bond Rapid Relief Anti-Itch cream (pramoxine + menthol), CeraVe Anti-itch cream or lotion (pramoxine), Sarna lotion (Original- menthol + camphor or Sensitive- pramoxine) or Eucerin 12 hour Itch Relief lotion (menthol) up to 3 times per day to areas on body that are itchy.  Melanoma ABCDEs  Melanoma is the most dangerous type of skin cancer, and is the leading cause of death from skin disease.  You are more likely to develop melanoma if you: Have light-colored skin, light-colored eyes, or red or blond hair Spend a lot of time in the sun Tan regularly, either outdoors or in a tanning bed Have had blistering sunburns, especially during childhood Have a close family member who has had a melanoma Have atypical moles or large birthmarks  Early detection of melanoma is key since treatment is typically straightforward and cure rates are extremely high if we catch it early.   The first sign of melanoma is often a change in a mole or a new dark spot.  The ABCDE system is a way of remembering the signs of melanoma.  A for asymmetry:  The two halves do not match. B for border:  The edges of the growth are irregular. C for color:  A mixture of colors are present instead of an even brown color. D for diameter:  Melanomas are usually (but not always) greater than 24m - the size of a pencil eraser. E for evolution:  The spot keeps changing in size, shape, and color.  Please check your skin once per month between visits. You can use a small mirror in front and a large mirror behind you to keep an eye on the back side or your body.   If you see any new or changing lesions before your next follow-up, please call to schedule a visit.  Please continue daily skin protection including broad spectrum sunscreen SPF 30+ to sun-exposed areas, reapplying every 2 hours as needed  when you're outdoors.    Due to recent changes in healthcare laws, you may see results of your pathology and/or laboratory studies on MyChart before the doctors have had a chance to review them. We understand that in some cases there may be results that are confusing or concerning to you. Please understand that not all results are received at the same time and often the doctors may need to interpret multiple results in order to provide you with the best plan of care or course of treatment. Therefore, we ask that you please give u[place]2 business days to thoroughly review all your results before contacting the office for clarification. Should we see a critical lab result, you will be contacted sooner.   If You Need Anything After Your Visit  If you have any questions or concerns for your doctor, please call our main line at [phone] press option 4 to reach your doctor's medical assistant. If no one answers, please leave a voicemail as directed and we will return your call as soon as possible. Messages left after 4 pm will be answered the following business day.   You may also send [place] message via [place] We typically respond to MyChart messages within 1-2 business days.  For prescription refills, please ask your pharmacy to contact our office. Our fax number is [phone]  If you have an urgent issue when the clinic is closed that cannot wait  until the next business day, you can page your doctor at the number below.    Please note that while we do our best to be available for urgent issues outside of office hours, we are not available 24/7.   If you have an urgent issue and are unable to reach [place], you may choose to seek medical care at your doctor's office, retail clinic, urgent care center, or emergency room.  If you have a medical emergency, please immediately call 911 or go to the emergency department.  Pager Numbers  - Dr. Nehemiah Massed: [phone]  - Dr. Laurence Ferrari: [phone]  - Dr.  Nicole Kindred: [phone]  In the event of inclement weather, please call our main line at [phone] for an update on the status of any delays or closures.  Dermatology Medication Tips: Please keep the boxes that topical medications come in in order to help keep track of the instructions about where and how to use these. Pharmacies typically print the medication instructions only on the boxes and not directly on the medication tubes.   If your medication is too expensive, please contact our office at [phone] option 4 or send [place] a message through [place].   We are unable to tell what your co-pay for medications will be in advance as this is different depending on your insurance coverage. However, we may be able to find a substitute medication at lower cost or fill out paperwork to get insurance to cover a needed medication.   If a prior authorization is required to get your medication covered by your insurance company, please allow [place] 1-2 business days to complete this process.  Drug prices often vary depending on where the prescription is filled and some pharmacies may offer cheaper prices.  The website www.goodrx.com contains coupons for medications through different pharmacies. The prices here do not account for what the cost may be with help from insurance (it may be cheaper with your insurance), but the website can give you the price if you did not use any insurance.  - You can print the associated coupon and take it with your prescription to the pharmacy.  - You may also stop by our office during regular business hours and pick up a GoodRx coupon card.  - If you need your prescription sent electronically to a different pharmacy, notify our office through [facility] or by phone at [phone] option 4.     Si Usted Necesita Algo Despus de Su Visita  Tambin puede enviarnos un mensaje a travs de Pharmacist, community. Por lo general respondemos a los mensajes de MyChart en el transcurso  de 1 a 2 das hbiles.  Para renovar recetas, por favor pida a su farmacia que se ponga en contacto con nuestra oficina. Harland Dingwall de fax es [place] [phone].  Si tiene un asunto urgente cuando la clnica est cerrada y que no puede esperar hasta el siguiente da hbil, puede llamar/localizar a su doctor(a) al nmero que aparece a continuacin.   Por favor, tenga en cuenta que aunque hacemos todo lo posible para estar disponibles para asuntos urgentes fuera del horario de [place], no estamos disponibles las 24 horas del da, los 7 das de la [place].   Si tiene un problema urgente y no puede comunicarse con nosotros, puede optar por buscar atencin mdica  en el consultorio de su doctor(a), en una clnica privada, en un centro de atencin urgente o en una sala de emergencias.  Si tiene [employer], por favor llame inmediatamente  al 911 o vaya a la sala de Multimedia programmer.  Nmeros de bper  - Dr. Nehemiah Massed: [phone]  - Dra. Moye: [phone]  - Dra. Nicole Kindred: [phone]  En caso de inclemencias del [place], por favor llame a Johnsie Kindred principal al [phone] para una actualizacin sobre el [place] de cualquier retraso o cierre.  Consejos para la medicacin en dermatologa: Por favor, guarde las cajas en las que vienen los medicamentos de uso tpico para ayudarle a seguir las instrucciones sobre dnde y cmo usarlos. Las farmacias generalmente imprimen las instrucciones del medicamento slo en las cajas y no directamente en los tubos del [place].   Si su medicamento es muy caro, por favor, pngase en contacto con Zigmund Daniel llamando al [phone] y presione la opcin 4 o envenos un mensaje a travs de Pharmacist, community.   No podemos decirle cul ser su copago por los medicamentos por adelantado ya que esto es diferente dependiendo de la cobertura de su seguro. Sin embargo, es posible que podamos encontrar un medicamento sustituto a Electrical engineer un formulario  para que el seguro cubra el medicamento que se considera necesario.   Si se requiere una autorizacin previa para que su compaa de seguros [place] su medicamento, por favor permtanos de 1 a 2 das hbiles para completar este proceso.  Los precios de los medicamentos varan con frecuencia dependiendo del Environmental consultant de dnde se surte la receta y alguna farmacias pueden ofrecer precios ms baratos.  El sitio web www.goodrx.com tiene cupones para medicamentos de Airline pilot. Los precios aqu no tienen en cuenta lo que podra costar con la ayuda del seguro (puede ser ms barato con su seguro), pero el sitio web puede darle el precio si no utiliz Research scientist (physical sciences).  - Puede imprimir el cupn correspondiente y llevarlo con su receta a la farmacia.  - Tambin puede pasar por nuestra oficina durante el horario de atencin regular y Charity fundraiser una tarjeta de cupones de GoodRx.  - Si necesita que su receta se enve electrnicamente a una farmacia diferente, informe a nuestra oficina a travs de MyChart de [place] o por telfono llamando al [phone] y presione la opcin 4.

## 2021-08-08 NOTE — Progress Notes (Signed)
Follow-Up Visit   Subjective  Victoria Marquez is a 88 y.o. female who presents for the following: Annual Exam (The patient presents for Total-Body Skin Exam (TBSE) for skin cancer screening and mole check.  The patient has spots, moles and lesions to be evaluated, some may be new or changing and the patient has concerns that these could be cancer. Patient with hx of AK's. Patient does have a spot at left cheek. ).   The following portions of the chart were reviewed this encounter and updated as appropriate:       Review of Systems:  No other skin or systemic complaints except as noted in HPI or Assessment and Plan.  Objective  Well appearing patient in no apparent distress; mood and affect are within normal limits.  A full examination was performed including scalp, head, eyes, ears, nose, lips, neck, chest, axillae, abdomen, back, buttocks, bilateral upper extremities, bilateral lower extremities, hands, feet, fingers, toes, fingernails, and toenails. All findings within normal limits unless otherwise noted below.  face Firm white papule at chin  right tragus 0.4 cm light gray blue macule  left lower cheek Erythematous stuck-on, waxy macule with scale    Assessment & Plan  Milia face  Benign-appearing.  Observation.  Call clinic for new or changing lesions.    Blue nevus right tragus  Vs hemangioma vs purpura  Benign-appearing.  Observation.  Call clinic for new or changing lesions.  Recommend daily use of broad spectrum spf 30+ sunscreen to sun-exposed areas.    Inflamed seborrheic keratosis left lower cheek  Symptomatic, irritating, patient would like treated.    Destruction of lesion - left lower cheek  Destruction method: cryotherapy   Informed consent: discussed and consent obtained   Lesion destroyed using liquid nitrogen: Yes   Region frozen until ice ball extended beyond lesion: Yes   Outcome: patient tolerated procedure well with no complications    Post-procedure details: wound care instructions given   Additional details:  Prior to procedure, discussed risks of blister formation, small wound, skin dyspigmentation, or rare scar following cryotherapy. Recommend Vaseline ointment to treated areas while healing.    Lentigines - Scattered tan macules - Due to sun exposure - Benign-appearing, observe - Recommend daily broad spectrum sunscreen SPF 30+ to sun-exposed areas, reapply every 2 hours as needed. - Call for any changes  Seborrheic Keratoses - Stuck-on, waxy, tan-brown papules and/or plaques  - Benign-appearing - Discussed benign etiology and prognosis. - Observe - Call for any changes  Melanocytic Nevi - Tan-brown and/or pink-flesh-colored symmetric macules and papules - Benign appearing on exam today - Observation - Call clinic for new or changing moles - Recommend daily use of broad spectrum spf 30+ sunscreen to sun-exposed areas.   Hemangiomas - Red papules - Discussed benign nature - Observe - Call for any changes  Actinic Damage - Chronic condition, secondary to cumulative UV/sun exposure - diffuse scaly erythematous macules with underlying dyspigmentation - Recommend daily broad spectrum sunscreen SPF 30+ to sun-exposed areas, reapply every 2 hours as needed.  - Staying in the shade or wearing long sleeves, sun glasses (UVA+UVB protection) and wide brim hats (4-inch brim around the entire circumference of the hat) are also recommended for sun protection.  - Call for new or changing lesions.  Skin cancer screening performed today.  Return in about 1 year (around 08/09/2022) for TBSE.  Graciella Belton, RMA, am acting as scribe for Brendolyn Patty, MD .  Documentation: I have reviewed the  above documentation for accuracy and completeness, and I agree with the above.  Brendolyn Patty MD

## 2021-10-17 ENCOUNTER — Ambulatory Visit: Payer: Medicare HMO | Admitting: Dermatology

## 2021-10-17 DIAGNOSIS — L821 Other seborrheic keratosis: Secondary | ICD-10-CM | POA: Diagnosis not present

## 2021-10-17 NOTE — Patient Instructions (Addendum)
Seborrheic Keratosis  What causes seborrheic keratoses? Seborrheic keratoses are harmless, common skin growths that first appear during adult life.  As time goes by, more growths appear.  Some people may develop a large number of them.  Seborrheic keratoses appear on both covered and uncovered body parts.  They are not caused by sunlight.  The tendency to develop seborrheic keratoses can be inherited.  They vary in color from skin-colored to gray, brown, or even black.  They can be either smooth or have a rough, warty surface.   Seborrheic keratoses are superficial and look as if they were stuck on the skin.  Under the microscope this type of keratosis looks like layers upon layers of skin.  That is why at times the top layer may seem to fall off, but the rest of the growth remains and re-grows.    Treatment Seborrheic keratoses do not need to be treated, but can easily be removed in the office.  Seborrheic keratoses often cause symptoms when they rub on clothing or jewelry.  Lesions can be in the way of shaving.  If they become inflamed, they can cause itching, soreness, or burning.  Removal of a seborrheic keratosis can be accomplished by freezing, burning, or surgery. If any spot bleeds, scabs, or grows rapidly, please return to have it checked, as these can be an indication of a skin cancer.  Cryotherapy Aftercare  Wash gently with soap and water everyday.   Apply Vaseline and Band-Aid daily until healed.    Due to recent changes in healthcare laws, you may see results of your pathology and/or laboratory studies on MyChart before the doctors have had a chance to review them. We understand that in some cases there may be results that are confusing or concerning to you. Please understand that not all results are received at the same time and often the doctors may need to interpret multiple results in order to provide you with the best plan of care or course of treatment. Therefore, we ask that you  please give us 2 business days to thoroughly review all your results before contacting the office for clarification. Should we see a critical lab result, you will be contacted sooner.   If You Need Anything After Your Visit  If you have any questions or concerns for your doctor, please call our main line at [phone] and press option 4 to reach your doctor's medical assistant. If no one answers, please leave a voicemail as directed and we will return your call as soon as possible. Messages left after 4 pm will be answered the following business day.   You may also send us a message via MyChart. We typically respond to MyChart messages within 1-2 business days.  For prescription refills, please ask your pharmacy to contact our office. Our fax number is [phone].  If you have an urgent issue when the clinic is closed that cannot wait until the next business day, you can page your doctor at the number below.    Please note that while we do our best to be available for urgent issues outside of office hours, we are not available 24/7.   If you have an urgent issue and are unable to reach us, you may choose to seek medical care at your doctor's office, retail clinic, urgent care center, or emergency room.  If you have a medical emergency, please immediately call 911 or go to the emergency department.  Pager Numbers  - Dr. Kowalski: [phone]  -   Dr. Moye: [phone]  - Dr. Stewart: [phone]  In the event of inclement weather, please call our main line at [phone] for an update on the status of any delays or closures.  Dermatology Medication Tips: Please keep the boxes that topical medications come in in order to help keep track of the instructions about where and how to use these. Pharmacies typically print the medication instructions only on the boxes and not directly on the medication tubes.   If your medication is too expensive, please contact our office at  [phone] option 4 or send us a message through MyChart.   We are unable to tell what your co-pay for medications will be in advance as this is different depending on your insurance coverage. However, we may be able to find a substitute medication at lower cost or fill out paperwork to get insurance to cover a needed medication.   If a prior authorization is required to get your medication covered by your insurance company, please allow us 1-2 business days to complete this process.  Drug prices often vary depending on where the prescription is filled and some pharmacies may offer cheaper prices.  The website www.goodrx.com contains coupons for medications through different pharmacies. The prices here do not account for what the cost may be with help from insurance (it may be cheaper with your insurance), but the website can give you the price if you did not use any insurance.  - You can print the associated coupon and take it with your prescription to the pharmacy.  - You may also stop by our office during regular business hours and pick up a GoodRx coupon card.  - If you need your prescription sent electronically to a different pharmacy, notify our office through [facility] MyChart or by phone at [phone] option 4.     Si Usted Necesita Algo Despus de Su Visita  Tambin puede enviarnos un mensaje a travs de MyChart. Por lo general respondemos a los mensajes de MyChart en el transcurso de 1 a 2 das hbiles.  Para renovar recetas, por favor pida a su farmacia que se ponga en contacto con nuestra oficina. Nuestro nmero de fax es el [phone].  Si tiene un asunto urgente cuando la clnica est cerrada y que no puede esperar hasta el siguiente da hbil, puede llamar/localizar a su doctor(a) al nmero que aparece a continuacin.   Por favor, tenga en cuenta que aunque hacemos todo lo posible para estar disponibles para asuntos urgentes fuera del horario de oficina, no estamos  disponibles las 24 horas del da, los 7 das de la semana.   Si tiene un problema urgente y no puede comunicarse con nosotros, puede optar por buscar atencin mdica  en el consultorio de su doctor(a), en una clnica privada, en un centro de atencin urgente o en una sala de emergencias.  Si tiene una emergencia mdica, por favor llame inmediatamente al 911 o vaya a la sala de emergencias.  Nmeros de bper  - Dr. Kowalski: [phone]  - Dra. Moye: [phone]  - Dra. Stewart: [phone]  En caso de inclemencias del tiempo, por favor llame a nuestra lnea principal al [phone] para una actualizacin sobre el estado de cualquier retraso o cierre.  Consejos para la medicacin en dermatologa: Por favor, guarde las cajas en las que vienen los medicamentos de uso tpico para ayudarle a seguir las instrucciones sobre dnde y cmo usarlos. Las farmacias generalmente imprimen las instrucciones del medicamento slo en las cajas y   no directamente en los tubos del medicamento.   Si su medicamento es muy caro, por favor, pngase en contacto con nuestra oficina llamando al [phone] y presione la opcin 4 o envenos un mensaje a travs de MyChart.   No podemos decirle cul ser su copago por los medicamentos por adelantado ya que esto es diferente dependiendo de la cobertura de su seguro. Sin embargo, es posible que podamos encontrar un medicamento sustituto a menor costo o llenar un formulario para que el seguro cubra el medicamento que se considera necesario.   Si se requiere una autorizacin previa para que su compaa de seguros cubra su medicamento, por favor permtanos de 1 a 2 das hbiles para completar este proceso.  Los precios de los medicamentos varan con frecuencia dependiendo del lugar de dnde se surte la receta y alguna farmacias pueden ofrecer precios ms baratos.  El sitio web www.goodrx.com tiene cupones para medicamentos de diferentes farmacias. Los precios aqu no  tienen en cuenta lo que podra costar con la ayuda del seguro (puede ser ms barato con su seguro), pero el sitio web puede darle el precio si no utiliz ningn seguro.  - Puede imprimir el cupn correspondiente y llevarlo con su receta a la farmacia.  - Tambin puede pasar por nuestra oficina durante el horario de atencin regular y recoger una tarjeta de cupones de GoodRx.  - Si necesita que su receta se enve electrnicamente a una farmacia diferente, informe a nuestra oficina a travs de MyChart de [place] o por telfono llamando al [phone] y presione la opcin 4.  

## 2021-10-17 NOTE — Progress Notes (Signed)
   Follow-Up Visit   Subjective  Victoria Marquez is a 87 y.o. female who presents for the following: Skin Problem (The patient has spot on forehead to be evaluated, some may be new or changing and the patient has concerns that these could be cancer. ). She would like it removed.  No symptoms.    The following portions of the chart were reviewed this encounter and updated as appropriate:       Review of Systems:  No other skin or systemic complaints except as noted in HPI or Assessment and Plan.  Objective  Well appearing patient in no apparent distress; mood and affect are within normal limits.  A focused examination was performed including face. Relevant physical exam findings are noted in the Assessment and Plan.  left upper forehead, right temple (2) Stuck-on, waxy, tan-brown papules --Discussed benign etiology and prognosis.          Assessment & Plan  Seborrheic keratosis (2) left upper forehead, right temple  Reassured benign age-related growth. Pt would like it removed.  Discussed cosmetic procedure, noncovered.  $60 for 1st lesion and $15 for each additional lesion if done on the same day.  Maximum charge $350.  One touch-up treatment included no charge. Discussed risks of treatment including dyspigmentation, small scar, and/or recurrence. Recommend daily broad spectrum sunscreen SPF 30+/photoprotection to treated areas once healed.   Non covered consent signed, 2 lesions treated, $75 charge  Destruction of lesion - left upper forehead, right temple  Destruction method: cryotherapy   Informed consent: discussed and consent obtained   Timeout:  patient name, date of birth, surgical site, and procedure verified Lesion destroyed using liquid nitrogen: Yes   Region frozen until ice ball extended beyond lesion: Yes   Outcome: patient tolerated procedure well with no complications   Post-procedure details: wound care instructions given   Additional details:  Prior  to procedure, discussed risks of blister formation, small wound, skin dyspigmentation, or rare scar following cryotherapy. Recommend Vaseline ointment to treated areas while healing.    Return if symptoms worsen or fail to improve.  I, Marye Round, CMA, am acting as scribe for Brendolyn Patty, MD .   Documentation: I have reviewed the above documentation for accuracy and completeness, and I agree with the above.  Brendolyn Patty MD

## 2021-12-28 ENCOUNTER — Ambulatory Visit: Payer: Medicare HMO | Admitting: Dermatology

## 2021-12-28 ENCOUNTER — Encounter: Payer: Self-pay | Admitting: Dermatology

## 2021-12-28 VITALS — BP 116/57 | HR 70

## 2021-12-28 DIAGNOSIS — L820 Inflamed seborrheic keratosis: Secondary | ICD-10-CM

## 2021-12-28 DIAGNOSIS — L821 Other seborrheic keratosis: Secondary | ICD-10-CM

## 2021-12-28 DIAGNOSIS — L578 Other skin changes due to chronic exposure to nonionizing radiation: Secondary | ICD-10-CM

## 2021-12-28 DIAGNOSIS — D1801 Hemangioma of skin and subcutaneous tissue: Secondary | ICD-10-CM

## 2021-12-28 NOTE — Progress Notes (Signed)
   Follow-Up Visit   Subjective  Victoria Marquez is a 88 y.o. female who presents for the following: lesion (Right breast. Dur: couple of months. Raised, rough. Scabs over at times).  The patient has spots, moles and lesions to be evaluated, some may be new or changing and the patient has concerns that these could be cancer.   The following portions of the chart were reviewed this encounter and updated as appropriate:      Review of Systems: No other skin or systemic complaints except as noted in HPI or Assessment and Plan.   Objective  Well appearing patient in no apparent distress; mood and affect are within normal limits.  A focused examination was performed including right breast and face. Relevant physical exam findings are noted in the Assessment and Plan.  Right Lateral Breast x1, Right Flank x2 (3) Erythematous keratotic or waxy stuck-on papule or plaque.   Assessment & Plan  Inflamed seborrheic keratosis (3) Right Lateral Breast x1, Right Flank x2  Symptomatic, irritating, patient would like treated.  Destruction of lesion - Right Lateral Breast x1, Right Flank x2  Destruction method: cryotherapy   Informed consent: discussed and consent obtained   Lesion destroyed using liquid nitrogen: Yes   Region frozen until ice ball extended beyond lesion: Yes   Outcome: patient tolerated procedure well with no complications   Post-procedure details: wound care instructions given   Additional details:  Prior to procedure, discussed risks of blister formation, small wound, skin dyspigmentation, or rare scar following cryotherapy. Recommend Vaseline ointment to treated areas while healing.   Seborrheic Keratoses - Stuck-on, waxy, tan-brown papules and/or plaques  - Benign-appearing - Discussed benign etiology and prognosis. - Observe - Call for any changes  Hemangiomas - Red papules - Discussed benign nature - Observe - Call for any changes   Actinic Damage -  chronic, secondary to cumulative UV radiation exposure/sun exposure over time - diffuse scaly erythematous macules with underlying dyspigmentation - Recommend daily broad spectrum sunscreen SPF 30+ to sun-exposed areas, reapply every 2 hours as needed.  - Recommend staying in the shade or wearing long sleeves, sun glasses (UVA+UVB protection) and wide brim hats (4-inch brim around the entire circumference of the hat). - Call for new or changing lesions.     Return for TBSE As Scheduled.  I, Emelia Salisbury, CMA, am acting as scribe for Brendolyn Patty, MD.  Documentation: I have reviewed the above documentation for accuracy and completeness, and I agree with the above.  Brendolyn Patty MD

## 2021-12-28 NOTE — Patient Instructions (Signed)
Cryotherapy Aftercare  Wash gently with soap and water everyday.   Apply Vaseline and Band-Aid daily until healed.     Due to recent changes in healthcare laws, you may see results of your pathology and/or laboratory studies on MyChart before the doctors have had a chance to review them. We understand that in some cases there may be results that are confusing or concerning to you. Please understand that not all results are received at the same time and often the doctors may need to interpret multiple results in order to provide you with the best plan of care or course of treatment. Therefore, we ask that you please give us 2 business days to thoroughly review all your results before contacting the office for clarification. Should we see a critical lab result, you will be contacted sooner.   If You Need Anything After Your Visit  If you have any questions or concerns for your doctor, please call our main line at [phone] and press option 4 to reach your doctor's medical assistant. If no one answers, please leave a voicemail as directed and we will return your call as soon as possible. Messages left after 4 pm will be answered the following business day.   You may also send us a message via MyChart. We typically respond to MyChart messages within 1-2 business days.  For prescription refills, please ask your pharmacy to contact our office. Our fax number is [phone].  If you have an urgent issue when the clinic is closed that cannot wait until the next business day, you can page your doctor at the number below.    Please note that while we do our best to be available for urgent issues outside of office hours, we are not available 24/7.   If you have an urgent issue and are unable to reach us, you may choose to seek medical care at your doctor's office, retail clinic, urgent care center, or emergency room.  If you have a medical emergency, please immediately call 911 or go to the  emergency department.  Pager Numbers  - Dr. Kowalski: [phone]  - Dr. Moye: [phone]  - Dr. Stewart: [phone]  In the event of inclement weather, please call our main line at [phone] for an update on the status of any delays or closures.  Dermatology Medication Tips: Please keep the boxes that topical medications come in in order to help keep track of the instructions about where and how to use these. Pharmacies typically print the medication instructions only on the boxes and not directly on the medication tubes.   If your medication is too expensive, please contact our office at [phone] option 4 or send us a message through MyChart.   We are unable to tell what your co-pay for medications will be in advance as this is different depending on your insurance coverage. However, we may be able to find a substitute medication at lower cost or fill out paperwork to get insurance to cover a needed medication.   If a prior authorization is required to get your medication covered by your insurance company, please allow us 1-2 business days to complete this process.  Drug prices often vary depending on where the prescription is filled and some pharmacies may offer cheaper prices.  The website www.goodrx.com contains coupons for medications through different pharmacies. The prices here do not account for what the cost may be with help from insurance (it may be cheaper with your insurance), but the website can   give you the price if you did not use any insurance.  - You can print the associated coupon and take it with your prescription to the pharmacy.  - You may also stop by our office during regular business hours and pick up a GoodRx coupon card.  - If you need your prescription sent electronically to a different pharmacy, notify our office through [facility] MyChart or by phone at [phone] option 4.     Si Usted Necesita Algo Despus de Su Visita  Tambin puede  enviarnos un mensaje a travs de MyChart. Por lo general respondemos a los mensajes de MyChart en el transcurso de 1 a 2 das hbiles.  Para renovar recetas, por favor pida a su farmacia que se ponga en contacto con nuestra oficina. Nuestro nmero de fax es el [phone].  Si tiene un asunto urgente cuando la clnica est cerrada y que no puede esperar hasta el siguiente da hbil, puede llamar/localizar a su doctor(a) al nmero que aparece a continuacin.   Por favor, tenga en cuenta que aunque hacemos todo lo posible para estar disponibles para asuntos urgentes fuera del horario de oficina, no estamos disponibles las 24 horas del da, los 7 das de la semana.   Si tiene un problema urgente y no puede comunicarse con nosotros, puede optar por buscar atencin mdica  en el consultorio de su doctor(a), en una clnica privada, en un centro de atencin urgente o en una sala de emergencias.  Si tiene una emergencia mdica, por favor llame inmediatamente al 911 o vaya a la sala de emergencias.  Nmeros de bper  - Dr. Kowalski: [phone]  - Dra. Moye: [phone]  - Dra. Stewart: [phone]  En caso de inclemencias del tiempo, por favor llame a nuestra lnea principal al [phone] para una actualizacin sobre el estado de cualquier retraso o cierre.  Consejos para la medicacin en dermatologa: Por favor, guarde las cajas en las que vienen los medicamentos de uso tpico para ayudarle a seguir las instrucciones sobre dnde y cmo usarlos. Las farmacias generalmente imprimen las instrucciones del medicamento slo en las cajas y no directamente en los tubos del medicamento.   Si su medicamento es muy caro, por favor, pngase en contacto con nuestra oficina llamando al [phone] y presione la opcin 4 o envenos un mensaje a travs de MyChart.   No podemos decirle cul ser su copago por los medicamentos por adelantado ya que esto es diferente dependiendo de la cobertura de su seguro.  Sin embargo, es posible que podamos encontrar un medicamento sustituto a menor costo o llenar un formulario para que el seguro cubra el medicamento que se considera necesario.   Si se requiere una autorizacin previa para que su compaa de seguros cubra su medicamento, por favor permtanos de 1 a 2 das hbiles para completar este proceso.  Los precios de los medicamentos varan con frecuencia dependiendo del lugar de dnde se surte la receta y alguna farmacias pueden ofrecer precios ms baratos.  El sitio web www.goodrx.com tiene cupones para medicamentos de diferentes farmacias. Los precios aqu no tienen en cuenta lo que podra costar con la ayuda del seguro (puede ser ms barato con su seguro), pero el sitio web puede darle el precio si no utiliz ningn seguro.  - Puede imprimir el cupn correspondiente y llevarlo con su receta a la farmacia.  - Tambin puede pasar por nuestra oficina durante el horario de atencin regular y recoger una tarjeta de cupones de GoodRx.  -   Si necesita que su receta se enve electrnicamente a una farmacia diferente, informe a nuestra oficina a travs de MyChart de [place] o por telfono llamando al [phone] y presione la opcin 4.  

## 2022-04-16 IMAGING — CT CT HEAD W/O CM
4 series · 16 of 47 positions shown, 18 images · non-contrast
Comparison: None.

CLINICAL DATA: PT states she blacked out this morning. She [place]
any head pain or headaches, no dizziness or double vision.



[Series 2: head bone · axial · 0.42mm/px · z∈[-63,-33]mm · 3 of 77 slices shown]
[im 8/77  bone]
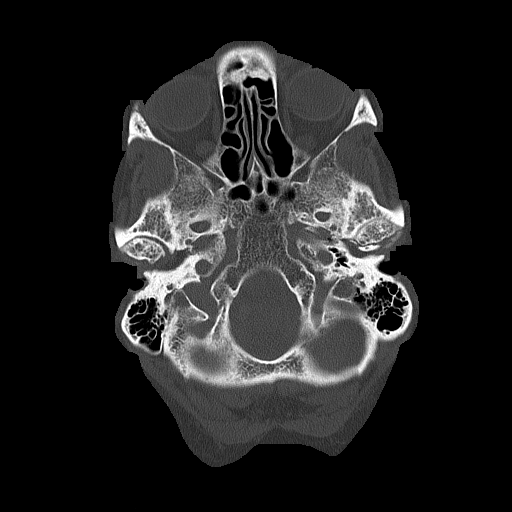
[im 16/77  bone]
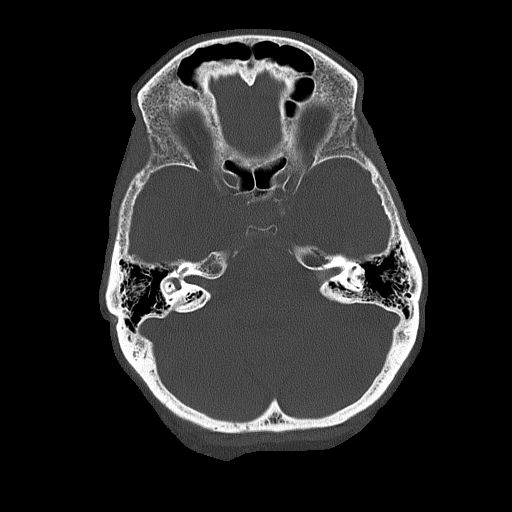
[im 23/77  bone]
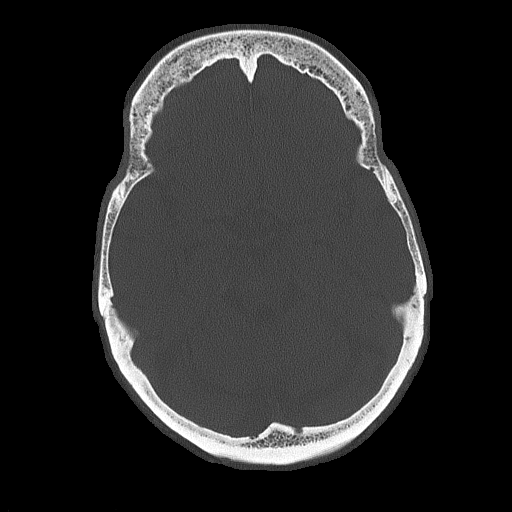

[Series 3: head wo · axial · 0.42mm/px · z∈[-62,+53]mm · 7 of 31 slices shown, 9 images]
[im 4/31  brain]
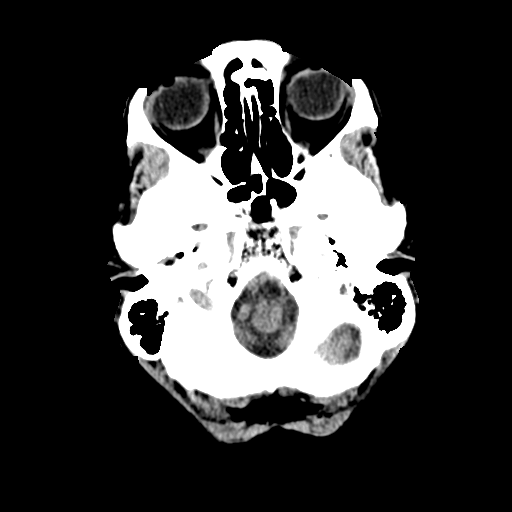
[im 4/31  bone]
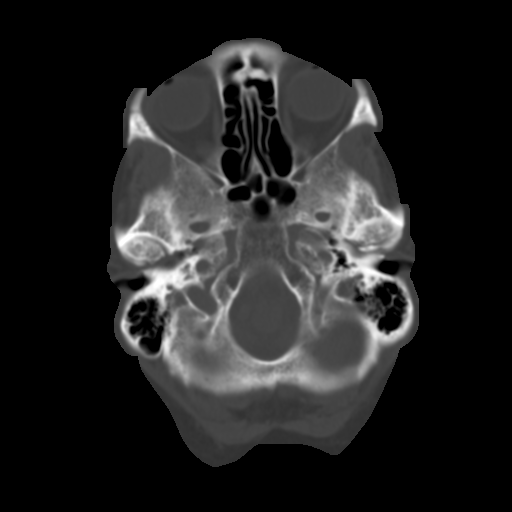
[im 8/31  brain]
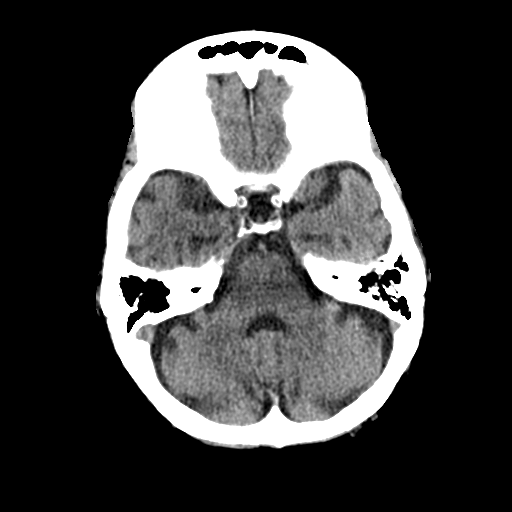
[im 12/31  brain]
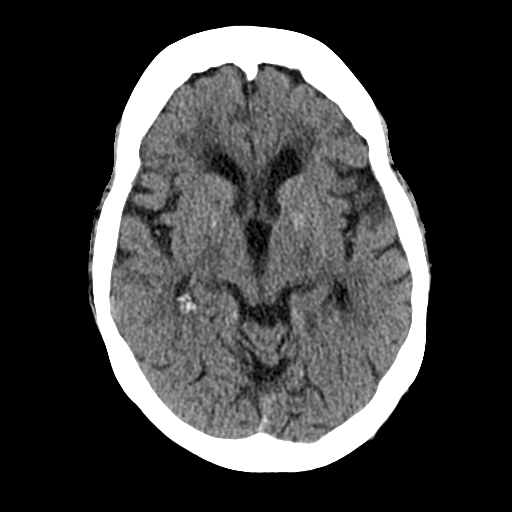
[im 16/31  brain]
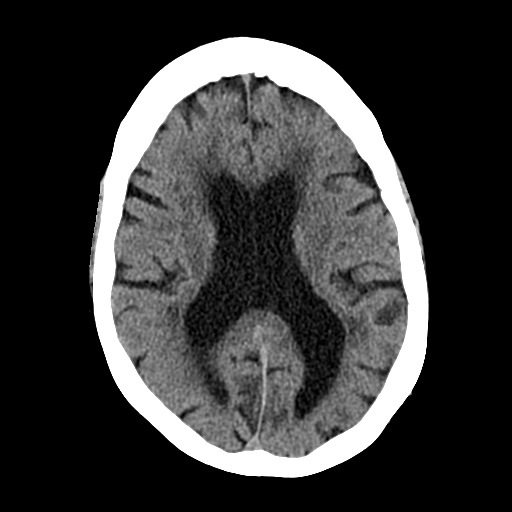
[im 19/31  brain]
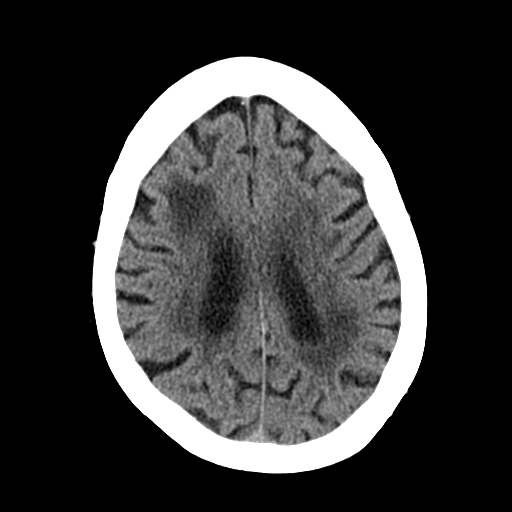
[im 19/31  bone]
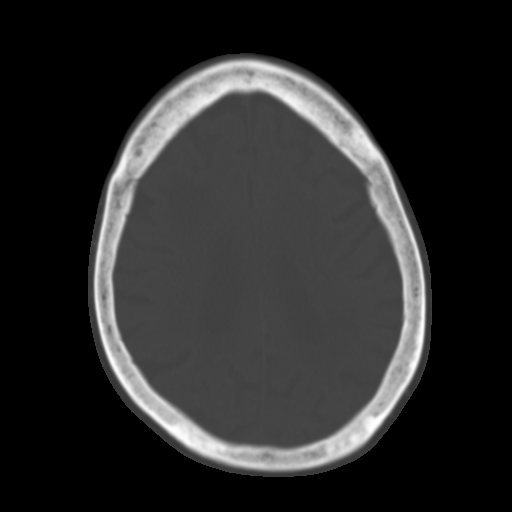
[im 23/31  brain]
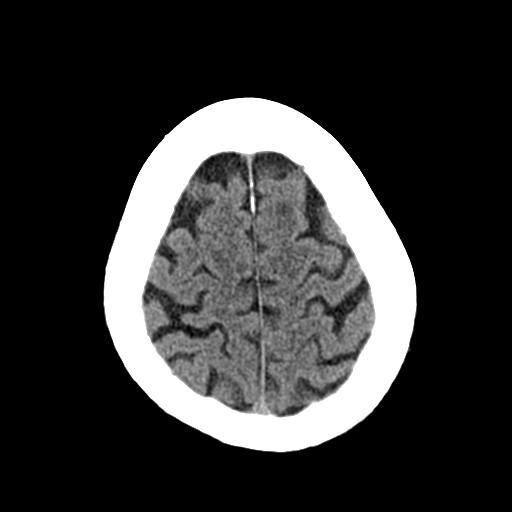
[im 27/31  brain]
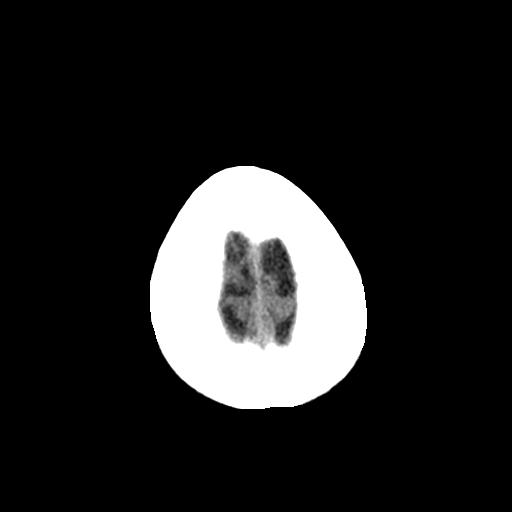

[Series 4: coronal soft tissue · coronal · 0.31mm/px · 3 of 64 slices shown]
[im 22/64  brain]
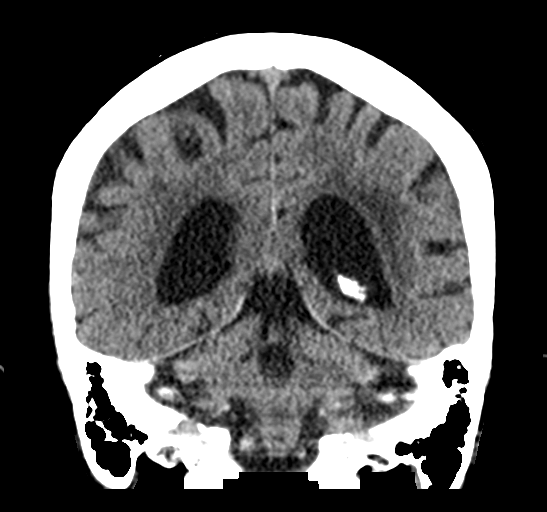
[im 29/64  brain]
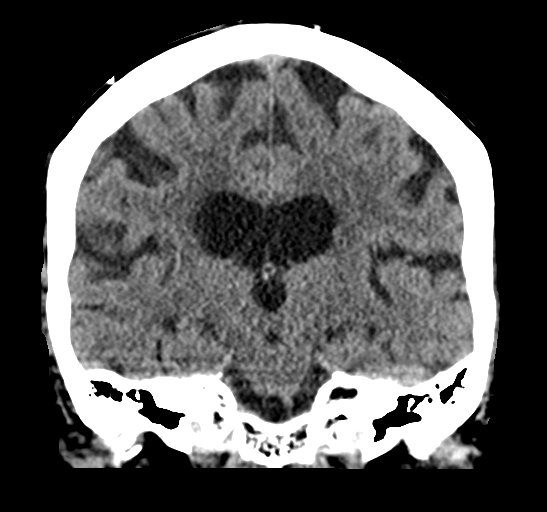
[im 36/64  brain]
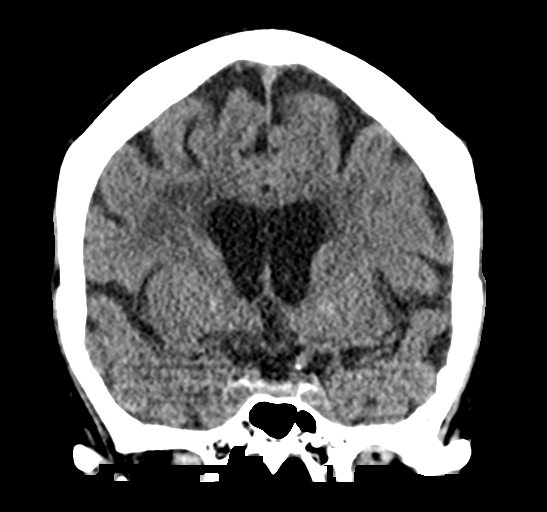

[Series 5: sagittal soft tissue · sagittal · 0.31mm/px · 3 of 57 slices shown]
[im 19/57  brain]
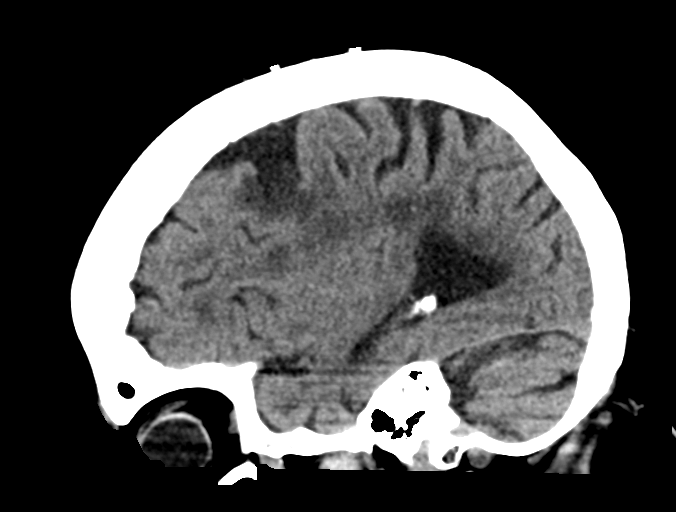
[im 29/57  brain]
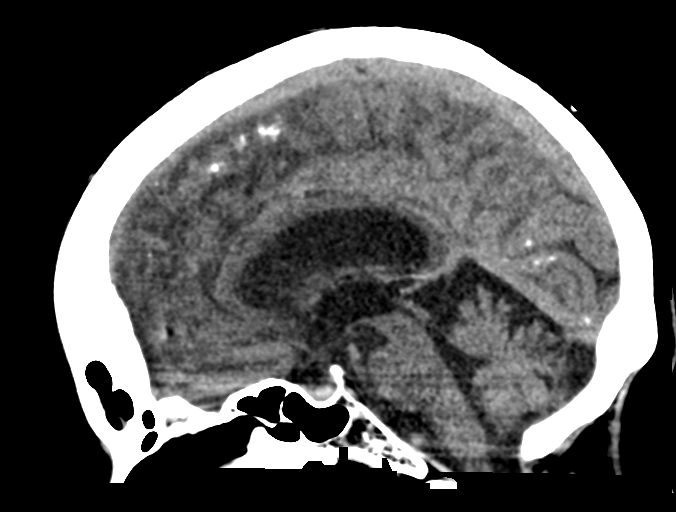
[im 38/57  brain]
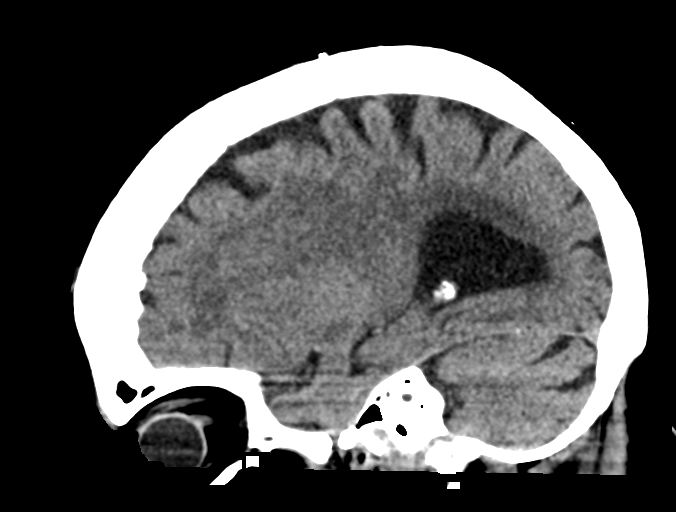

[16 of 47 positions shown; findings below may reference images not displayed]

FINDINGS: Brain:

prominence of the lateral ventricles may be related to central
predominant atrophy, although a component of normal
pressure/communicating hydrocephalus cannot be excluded. Patchy and
confluent areas of decreased attenuation are noted throughout the
deep and periventricular white matter of the cerebral hemispheres
bilaterally, compatible with chronic microvascular ischemic disease.

No evidence of large-territorial acute infarction. No parenchymal
hemorrhage. No mass lesion. No extra-axial collection.

No mass effect or midline shift. No hydrocephalus. Basilar cisterns
are patent.

Vascular: No hyperdense vessel. Atherosclerotic calcifications are
present within the cavernous internal carotid arteries.

Skull: No acute fracture or focal lesion.

Sinuses/Orbits: Paranasal sinuses and mastoid air cells are clear.
Bilateral lens replacement. Orbits are unremarkable.

Other: None.
IMPRESSION: 1. No acute intracranial abnormality.
2. Prominence of the lateral ventricles may be related to central
predominant atrophy, although a component of normal
pressure/communicating hydrocephalus cannot be excluded.
3. Chronic microvascular ischemic changes.

## 2022-08-15 ENCOUNTER — Ambulatory Visit (INDEPENDENT_AMBULATORY_CARE_PROVIDER_SITE_OTHER): Payer: Medicare HMO | Admitting: Dermatology

## 2022-08-15 VITALS — BP 140/86 | HR 63

## 2022-08-15 DIAGNOSIS — L570 Actinic keratosis: Secondary | ICD-10-CM

## 2022-08-15 DIAGNOSIS — D1801 Hemangioma of skin and subcutaneous tissue: Secondary | ICD-10-CM

## 2022-08-15 DIAGNOSIS — L821 Other seborrheic keratosis: Secondary | ICD-10-CM

## 2022-08-15 DIAGNOSIS — W908XXA Exposure to other nonionizing radiation, initial encounter: Secondary | ICD-10-CM

## 2022-08-15 DIAGNOSIS — D2321 Other benign neoplasm of skin of right ear and external auricular canal: Secondary | ICD-10-CM

## 2022-08-15 DIAGNOSIS — L814 Other melanin hyperpigmentation: Secondary | ICD-10-CM

## 2022-08-15 DIAGNOSIS — Z1283 Encounter for screening for malignant neoplasm of skin: Secondary | ICD-10-CM | POA: Diagnosis not present

## 2022-08-15 DIAGNOSIS — L720 Epidermal cyst: Secondary | ICD-10-CM

## 2022-08-15 DIAGNOSIS — L578 Other skin changes due to chronic exposure to nonionizing radiation: Secondary | ICD-10-CM

## 2022-08-15 DIAGNOSIS — Z872 Personal history of diseases of the skin and subcutaneous tissue: Secondary | ICD-10-CM

## 2022-08-15 DIAGNOSIS — D229 Melanocytic nevi, unspecified: Secondary | ICD-10-CM

## 2022-08-15 DIAGNOSIS — L918 Other hypertrophic disorders of the skin: Secondary | ICD-10-CM

## 2022-08-15 DIAGNOSIS — L853 Xerosis cutis: Secondary | ICD-10-CM

## 2022-08-15 NOTE — Progress Notes (Signed)
Follow-Up Visit   Subjective  Victoria Marquez is a 89 y.o. female who presents for the following: Skin Cancer Screening and Full Body Skin Exam hx of isk, hx of aks,  The patient presents for Total-Body Skin Exam (TBSE) for skin cancer screening and mole check. The patient has spots, moles and lesions to be evaluated, some may be new or changing and the patient may have concern these could be cancer.    The following portions of the chart were reviewed this encounter and updated as appropriate: medications, allergies, medical history  Review of Systems:  No other skin or systemic complaints except as noted in HPI or Assessment and Plan.  Objective  Well appearing patient in no apparent distress; mood and affect are within normal limits.  A full examination was performed including scalp, head, eyes, ears, nose, lips, neck, chest, axillae, abdomen, back, buttocks, bilateral upper extremities, bilateral lower extremities, hands, feet, fingers, toes, fingernails, and toenails. All findings within normal limits unless otherwise noted below.   Relevant physical exam findings are noted in the Assessment and Plan.  Right Dorsal Hand Erythematous thin papules/macules with gritty scale.     Assessment & Plan   SKIN CANCER SCREENING PERFORMED TODAY.  ACTINIC DAMAGE - Chronic condition, secondary to cumulative UV/sun exposure - diffuse scaly erythematous macules with underlying dyspigmentation - Recommend daily broad spectrum sunscreen SPF 30+ to sun-exposed areas, reapply every 2 hours as needed.  - Staying in the shade or wearing long sleeves, sun glasses (UVA+UVB protection) and wide brim hats (4-inch brim around the entire circumference of the hat) are also recommended for sun protection.  - Call for new or changing lesions.  LENTIGINES, SEBORRHEIC KERATOSES, HEMANGIOMAS - Benign normal skin lesions - Benign-appearing - Call for any changes Sk at right axilla   Acrochordons  (Skin Tags) Right axilla  - Fleshy, skin-colored pedunculated papules - Benign appearing.  - Observe. - If desired, they can be removed with an in office procedure that is not covered by insurance. - Please call the clinic if you notice any new or changing lesions.   MELANOCYTIC NEVI - Tan-brown and/or pink-flesh-colored symmetric macules and papules - Benign appearing on exam today - Observation - Call clinic for new or changing moles - Recommend daily use of broad spectrum spf 30+ sunscreen to sun-exposed areas.   Blue nevus Vs hemangioma  right tragus Exam: 0.4 cm light gray blue macule   Benign-appearing. Stable compared to previous visit. Observation.  Call clinic for new or changing moles.  Recommend daily use of broad spectrum spf 30+ sunscreen to sun-exposed areas.      Xerosis At back - diffuse xerotic patches - recommend gentle, hydrating skin care - gentle skin care handout given  MILIA At face Exam: tiny erythematous firm white papule  Discussed this is a type of cyst. Benign-appearing.   Treatment Plan: Benign. Observe.    Actinic keratosis Right Dorsal Hand  Vs ISK  Recommend cryotherapy.  Pt defers treatment today of R hand dorsum. will watch and observe for changes  Actinic keratoses are precancerous spots that appear secondary to cumulative UV radiation exposure/sun exposure over time. They are chronic with expected duration over 1 year. A portion of actinic keratoses will progress to squamous cell carcinoma of the skin. It is not possible to reliably predict which spots will progress to skin cancer and so treatment is recommended to prevent development of skin cancer.  Recommend daily broad spectrum sunscreen SPF 30+ to  sun-exposed areas, reapply every 2 hours as needed.  Recommend staying in the shade or wearing long sleeves, sun glasses (UVA+UVB protection) and wide brim hats (4-inch brim around the entire circumference of the hat). Call for new or  changing lesions.   Return in about 1 year (around 08/15/2023) for TBSE.  I, Asher Muir, CMA, am acting as scribe for Willeen Niece, MD.   Documentation: I have reviewed the above documentation for accuracy and completeness, and I agree with the above.  Willeen Niece, MD

## 2022-08-15 NOTE — Patient Instructions (Addendum)
Gentle Skin Care Guide  1. Bathe no more than once a day.  2. Avoid bathing in hot water  3. Use a mild soap like Dove, Vanicream, Cetaphil, CeraVe. Can use Lever 2000 or Cetaphil antibacterial soap  4. Use soap only where you need it. On most days, use it under your arms, between your legs, and on your feet. Let the water rinse other areas unless visibly dirty.  5. When you get out of the bath/shower, use a towel to gently blot your skin dry, don't rub it.  6. While your skin is still a little damp, apply a moisturizing cream such as Vanicream, CeraVe, Cetaphil, Eucerin, Sarna lotion or plain Vaseline Jelly. For hands apply Neutrogena [place] Hand Cream or Excipial Hand Cream.  7. Reapply moisturizer any time you start to itch or feel dry.  8. Sometimes using free and clear laundry detergents can be helpful. Fabric softener sheets should be avoided. Downy Free & Gentle liquid, or any liquid fabric softener that is free of dyes and perfumes, it acceptable to use  9. If your doctor has given you prescription creams you may apply moisturizers over them      Melanoma ABCDEs  Melanoma is the most dangerous type of skin cancer, and is the leading cause of death from skin disease.  You are more likely to develop melanoma if you: Have light-colored skin, light-colored eyes, or red or blond hair Spend a lot of time in the sun Tan regularly, either outdoors or in a tanning bed Have had blistering sunburns, especially during childhood Have a close family member who has had a melanoma Have atypical moles or large birthmarks  Early detection of melanoma is key since treatment is typically straightforward and cure rates are extremely high if we catch it early.   The first sign of melanoma is often a change in a mole or a new dark spot.  The ABCDE system is a way of remembering the signs of melanoma.  A for asymmetry:  The two halves do not match. B for border:  The edges of the growth are  irregular. C for color:  A mixture of colors are present instead of an even brown color. D for diameter:  Melanomas are usually (but not always) greater than 6mm - the size of a pencil eraser. E for evolution:  The spot keeps changing in size, shape, and color.  Please check your skin once per month between visits. You can use a small mirror in front and a large mirror behind you to keep an eye on the back side or your body.   If you see any new or changing lesions before your next follow-up, please call to schedule a visit.  Please continue daily skin protection including broad spectrum sunscreen SPF 30+ to sun-exposed areas, reapplying every 2 hours as needed when you're outdoors.   Staying in the shade or wearing long sleeves, sun glasses (UVA+UVB protection) and wide brim hats (4-inch brim around the entire circumference of the hat) are also recommended for sun protection.     Due to recent changes in healthcare laws, you may see results of your pathology and/or laboratory studies on MyChart before the doctors have had a chance to review them. We understand that in some cases there may be results that are confusing or concerning to you. Please understand that not all results are received at the same time and often the doctors may need to interpret multiple results in order to provide  you with the best plan of care or course of treatment. Therefore, we ask that you please give [place] 2 business days to thoroughly review all your results before contacting the office for clarification. Should we see a critical lab result, you will be contacted sooner.   If You Need Anything After Your Visit  If you have any questions or concerns for your doctor, please call our main line at [phone] and press option 4 to reach your doctor's medical assistant. If no one answers, please leave a voicemail as directed and we will return your call as soon as possible. Messages left after 4 pm will be answered the  following business day.   You may also send [place] a message via MyChart. We typically respond to MyChart messages within 1-2 business days.  For prescription refills, please ask your pharmacy to contact our office. Our fax number is [phone].  If you have an urgent issue when the clinic is closed that cannot wait until the next business day, you can page your doctor at the number below.    Please note that while we do our best to be available for urgent issues outside of office hours, we are not available 24/7.   If you have an urgent issue and are unable to reach [place], you may choose to seek medical care at your doctor's office, retail clinic, urgent care center, or emergency room.  If you have a medical emergency, please immediately call 911 or go to the emergency department.  Pager Numbers  - Dr. Gwen Pounds: [phone]  - Dr. Roseanne Reno: [phone]  In the event of inclement weather, please call our main line at [phone] for an update on the status of any delays or closures.  Dermatology Medication Tips: Please keep the boxes that topical medications come in in order to help keep track of the instructions about where and how to use these. Pharmacies typically print the medication instructions only on the boxes and not directly on the medication tubes.   If your medication is too expensive, please contact our office at [phone] option 4 or send [place] a message through MyChart.   We are unable to tell what your co-pay for medications will be in advance as this is different depending on your insurance coverage. However, we may be able to find a substitute medication at lower cost or fill out paperwork to get insurance to cover a needed medication.   If a prior authorization is required to get your medication covered by your insurance company, please allow [place] 1-2 business days to complete this process.  Drug prices often vary depending on where the prescription is filled and some  pharmacies may offer cheaper prices.  The website www.goodrx.com contains coupons for medications through different pharmacies. The prices here do not account for what the cost may be with help from insurance (it may be cheaper with your insurance), but the website can give you the price if you did not use any insurance.  - You can print the associated coupon and take it with your prescription to the pharmacy.  - You may also stop by our office during regular business hours and pick up a GoodRx coupon card.  - If you need your prescription sent electronically to a different pharmacy, notify our office through [facility] or by phone at [phone] option 4.     Si Usted Necesita Algo Despus de Su Visita  Tambin puede enviarnos un mensaje a travs de Clinical cytogeneticist. Por lo  general respondemos a los mensajes de MyChart en el transcurso de 1 a 2 das hbiles.  Para renovar recetas, por favor pida a su farmacia que se ponga en contacto con nuestra oficina. Annie Sable de fax es [place] [phone].  Si tiene un asunto urgente cuando la clnica est cerrada y que no puede esperar hasta el siguiente da hbil, puede llamar/localizar a su doctor(a) al nmero que aparece a continuacin.   Por favor, tenga en cuenta que aunque hacemos todo lo posible para estar disponibles para asuntos urgentes fuera del horario de [place], no estamos disponibles las 24 horas del da, los 7 [address]  [address] de la [place].   Si tiene un problema urgente y no puede comunicarse con nosotros, puede optar por buscar atencin mdica  en el consultorio de su doctor(a), en una clnica privada, en un centro de atencin urgente o en una sala de emergencias.  Si tiene Engineer, drilling, por favor llame inmediatamente al 911 o vaya a la sala de emergencias.  Nmeros de bper  - Dr. Gwen Pounds: [phone]  - Dra. Roseanne Reno: [phone]  En caso de inclemencias del [place], por favor llame a Lacy Duverney principal al [phone]  para una actualizacin sobre el [place] de cualquier retraso o cierre.  Consejos para la medicacin en dermatologa: Por favor, guarde las cajas en las que vienen los medicamentos de uso tpico para ayudarle a seguir las instrucciones sobre dnde y cmo usarlos. Las farmacias generalmente imprimen las instrucciones del medicamento slo en las cajas y no directamente en los tubos del [place].   Si su medicamento es muy caro, por favor, pngase en contacto con Rolm [place]ala llamando al [phone] y presione la opcin 4 o envenos un mensaje a travs de Clinical cytogeneticist.   No podemos decirle cul ser su copago por los medicamentos por adelantado ya que esto es diferente dependiendo de la cobertura de su seguro. Sin embargo, es posible que podamos encontrar un medicamento sustituto a Audiological scientist un formulario para que el seguro cubra el medicamento que se considera necesario.   Si se requiere una autorizacin previa para que su compaa de seguros [place] su medicamento, por favor permtanos de 1 a 2 das hbiles para completar [address]  Los precios de los medicamentos varan con frecuencia dependiendo del Environmental consultant de dnde se surte la receta y alguna farmacias pueden ofrecer precios ms baratos.  El sitio web www.goodrx.com tiene cupones para medicamentos de Health and safety inspector. Los precios aqu no tienen en cuenta lo que podra costar con la ayuda del seguro (puede ser ms barato con su seguro), pero el sitio web puede darle el precio si no utiliz Tourist information centre manager.  - Puede imprimir el cupn correspondiente y llevarlo con su receta a la farmacia.  - Tambin puede pasar por nuestra oficina durante el horario de atencin regular y Education officer, museum una tarjeta de cupones de GoodRx.  - Si necesita que su receta se enve electrnicamente a una farmacia diferente, informe a nuestra oficina a travs de MyChart de [place] o por telfono llamando al [phone] y presione la opcin 4.

## 2022-08-23 ENCOUNTER — Other Ambulatory Visit: Payer: Self-pay

## 2022-08-23 ENCOUNTER — Emergency Department: Payer: Medicare HMO

## 2022-08-23 ENCOUNTER — Emergency Department
Admission: EM | Admit: 2022-08-23 | Discharge: 2022-08-23 | Disposition: A | Discharge status: Home / Self Care | Payer: Medicare HMO | Attending: Emergency Medicine | Admitting: Emergency Medicine

## 2022-08-23 DIAGNOSIS — M25561 Pain in right knee: Secondary | ICD-10-CM | POA: Diagnosis not present

## 2022-08-23 DIAGNOSIS — S0990XA Unspecified injury of head, initial encounter: Secondary | ICD-10-CM | POA: Insufficient documentation

## 2022-08-23 DIAGNOSIS — W133XXA Fall through floor, initial encounter: Secondary | ICD-10-CM | POA: Diagnosis not present

## 2022-08-23 DIAGNOSIS — S92504A Nondisplaced unspecified fracture of right lesser toe(s), initial encounter for closed fracture: Secondary | ICD-10-CM | POA: Diagnosis not present

## 2022-08-23 DIAGNOSIS — M79671 Pain in right foot: Secondary | ICD-10-CM | POA: Diagnosis not present

## 2022-08-23 DIAGNOSIS — Z23 Encounter for immunization: Secondary | ICD-10-CM | POA: Diagnosis not present

## 2022-08-23 DIAGNOSIS — S41111A Laceration without foreign body of right upper arm, initial encounter: Secondary | ICD-10-CM | POA: Insufficient documentation

## 2022-08-23 DIAGNOSIS — S40921A Unspecified superficial injury of right upper arm, initial encounter: Secondary | ICD-10-CM | POA: Diagnosis present

## 2022-08-23 DIAGNOSIS — S92501A Displaced unspecified fracture of right lesser toe(s), initial encounter for closed fracture: Secondary | ICD-10-CM

## 2022-08-23 DIAGNOSIS — W19XXXA Unspecified fall, initial encounter: Secondary | ICD-10-CM

## 2022-08-23 DIAGNOSIS — Y92002 Bathroom of unspecified non-institutional (private) residence single-family (private) house as the place of occurrence of the external cause: Secondary | ICD-10-CM | POA: Insufficient documentation

## 2022-08-23 MED ORDER — LIDOCAINE HCL (PF) 1 % IJ SOLN
10.0000 mL | Freq: Once | INTRAMUSCULAR | Status: AC
  Administered 2022-08-23: 10 mL via INTRADERMAL
  Filled 2022-08-23: qty 10

## 2022-08-23 MED ORDER — TETANUS-DIPHTH-ACELL PERTUSSIS 5-2.5-18.5 LF-MCG/0.5 IM SUSY
0.5000 mL | PREFILLED_SYRINGE | Freq: Once | INTRAMUSCULAR | Status: AC
  Administered 2022-08-23: 0.5 mL via INTRAMUSCULAR
  Filled 2022-08-23: qty 0.5

## 2022-08-23 NOTE — Discharge Instructions (Signed)
Follow-up with either your doctor or someone at [facility] for bandage change in 2 days.  Sutures should remain for 10 to 12 days.  The area is clean and dry as possible.

## 2022-08-23 NOTE — ED Notes (Signed)
[facility] called this Rn to inform staff that transportation is on the way to pick up patient.

## 2022-08-23 NOTE — ED Notes (Signed)
Pt ambulated to bathroom with this Rn

## 2022-08-23 NOTE — ED Triage Notes (Signed)
Pt arrives from [facility] via [facility] with complaints of a fall. According to EMS, the pt was in the bathroom, lost her balance and fell. Pt  then hit the wall and fell to the floor. Pt has a laceration to the right upper arm. Pt doesn't complain of pain at this time. Pt is alert and oriented x4, with no signs of distress at this time.

## 2022-08-23 NOTE — ED Provider Notes (Signed)
[facility] Provider Note    Event Date/Time   First MD Initiated Contact with Patient 08/23/22 [phone]     (approximate)   History   Fall   HPI  Victoria Marquez is a 89 y.o. female with history of osteopenia, hyperglycemia, hyperlipidemia presents emergency department after a fall.  Patient arrived via EMS from [facility].  States she was in the bathroom lost her balance hit a knob on the cabinet and then fell to the floor.  No LOC.  States has a laceration to her arm, right foot pain, right knee pain, was able to pull herself up and sat on the toilet until someone came to get her.      Physical Exam   Triage Vital Signs: ED Triage Vitals [08/23/22 0817]  Encounter Vitals Group     BP      Systolic BP Percentile      Diastolic BP Percentile      Pulse      Resp      Temp      Temp src      SpO2      Weight 139 lb 15.9 oz (63.5 kg)     Height 5\' 4"  (1.626 m)     Head Circumference      Peak Flow      Pain Score 0     Pain Loc      Pain Education      Exclude from Growth Chart     Most recent vital signs: Vitals:   08/23/22 1000 08/23/22 1100  BP:    Pulse: (!) 56 (!) 59  Resp:    Temp:    SpO2:       General: Awake, no distress.   CV:  Good peripheral perfusion. regular rate and  rhythm Resp:  Normal effort.  Abd:  No distention.   Other:  Right arm with bandage covering laceration, area is tender to palpation, right foot is tender, right ankle and knee are tender, tib-fib's mildly tender but does not appear to be bruised.  C-spine is nontender   ED Results / Procedures / Treatments   Labs (all labs ordered are listed, but only abnormal results are displayed) Labs Reviewed - No data to display   EKG     RADIOLOGY CT of the head, x-ray of the pelvis, right humerus, right tib-fib and right foot    PROCEDURES:   .Marland KitchenLaceration Repair  Date/Time: 08/23/2022 12:41 PM  Performed by: Faythe Ghee, PA-C Authorized  by: Faythe Ghee, PA-C   Consent:    Consent obtained:  Verbal   Consent given by:  Patient   Risks, benefits, and alternatives were discussed: yes     Risks discussed:  Infection, pain, retained foreign body, need for additional repair, poor cosmetic result, tendon damage, vascular damage, poor wound healing and nerve damage   Alternatives discussed:  No treatment Universal protocol:    Procedure explained and questions answered to patient or proxy's satisfaction: yes     Immediately prior to procedure, a time out was called: yes     Patient identity confirmed:  Verbally with patient Anesthesia:    Anesthesia method:  Local infiltration   Local anesthetic:  Lidocaine 1% w/o epi Laceration details:    Location:  Shoulder/arm   Shoulder/arm location:  R upper arm   Length (cm):  10 Pre-procedure details:    Preparation:  Patient was prepped and draped in usual sterile  fashion and imaging obtained to evaluate for foreign bodies Exploration:    Hemostasis achieved with:  Direct pressure   Imaging obtained: x-ray     Imaging outcome: foreign body not noted     Wound exploration: wound explored through full range of motion and entire depth of wound visualized     Wound extent: areolar tissue not violated, fascia not violated, no foreign body, no signs of injury, no nerve damage, no tendon damage, no underlying fracture and no vascular damage     Contaminated: no   Treatment:    Area cleansed with:  Povidone-iodine   Amount of cleaning:  Standard   Irrigation solution:  Sterile saline   Irrigation method:  Tap Skin repair:    Repair method:  Sutures   Suture size:  4-0   Suture material:  Nylon   Suture technique:  Simple interrupted   Number of sutures:  10 Approximation:    Approximation:  Close Repair type:    Repair type:  Simple Post-procedure details:    Dressing:  Non-adherent dressing   Procedure completion:  Tolerated well, no immediate  complications    MEDICATIONS ORDERED IN ED: Medications  lidocaine (PF) (XYLOCAINE) 1 % injection 10 mL (10 mLs Intradermal Given 08/23/22 1049)  Tdap (BOOSTRIX) injection 0.5 mL (0.5 mLs Intramuscular Given 08/23/22 1059)     IMPRESSION / MDM / ASSESSMENT AND PLAN / ED COURSE  I reviewed the triage vital signs and the nursing notes.                              Differential diagnosis includes, but is not limited to, subdural, SAH, fracture, contusion, strain, laceration  Patient's presentation is most consistent with acute presentation with potential threat to life or bodily function.   Due to the patient's age along with mechanical fall we will do a CT of the head to ensure no subdural or SAH  X-ray of the pelvis, right tib-fib, right foot, and right humerus.  Will assess laceration after x-ray   CT of the head independently reviewed interpreted by me as being negative for acute abnormality.  Confirmed by radiology  X-ray of the pelvis, right tib-fib, and right humerus independently reviewed interpreted by me as being negative for any acute abnormality.  X-ray of the right foot does show a fracture at the right fifth toe.  Independently reviewed interpreted by me and confirmed by radiology  Explained the findings to the patient.  Toes were buddy taped.  See procedure note for laceration repair  I did explain all the findings to the patient.  We went over suture care instructions.  She lives at [facility] so she will have access to close follow-up.  Return emergency department if any sign of infection.  Sutures should be removed in 10 to 12 days.  Patient is in agreement treatment plan.  Tdap was updated.  Discharged stable condition.  FINAL CLINICAL IMPRESSION(S) / ED DIAGNOSES   Final diagnoses:  Fall, initial encounter  Laceration of right upper arm, initial encounter  Fracture of fifth toe, right, closed, initial encounter     Rx / DC Orders   ED Discharge Orders      None        Note:  This document was prepared using Dragon voice recognition software and may include unintentional dictation errors.    Faythe Ghee, PA-C 08/23/22 1243    Dionne Bucy, MD 08/23/22  1604  

## 2023-04-03 ENCOUNTER — Other Ambulatory Visit: Payer: Self-pay | Admitting: Internal Medicine

## 2023-04-03 DIAGNOSIS — R1013 Epigastric pain: Secondary | ICD-10-CM

## 2023-04-03 DIAGNOSIS — R110 Nausea: Secondary | ICD-10-CM

## 2023-04-03 DIAGNOSIS — I10 Essential (primary) hypertension: Secondary | ICD-10-CM

## 2023-04-06 ENCOUNTER — Ambulatory Visit
Admission: RE | Admit: 2023-04-06 | Discharge: 2023-04-06 | Disposition: A | Discharge status: Home / Self Care | Source: Ambulatory Visit | Attending: Internal Medicine | Admitting: Internal Medicine

## 2023-04-06 DIAGNOSIS — R110 Nausea: Secondary | ICD-10-CM | POA: Insufficient documentation

## 2023-04-06 DIAGNOSIS — R1013 Epigastric pain: Secondary | ICD-10-CM | POA: Diagnosis present

## 2023-04-06 DIAGNOSIS — I10 Essential (primary) hypertension: Secondary | ICD-10-CM | POA: Diagnosis present

## 2023-04-15 ENCOUNTER — Emergency Department

## 2023-04-15 ENCOUNTER — Emergency Department
Admission: EM | Admit: 2023-04-15 | Discharge: 2023-04-15 | Disposition: A | Discharge status: Home / Self Care | Attending: Emergency Medicine | Admitting: Emergency Medicine

## 2023-04-15 ENCOUNTER — Other Ambulatory Visit: Payer: Self-pay

## 2023-04-15 DIAGNOSIS — N189 Chronic kidney disease, unspecified: Secondary | ICD-10-CM | POA: Insufficient documentation

## 2023-04-15 DIAGNOSIS — I129 Hypertensive chronic kidney disease with stage 1 through stage 4 chronic kidney disease, or unspecified chronic kidney disease: Secondary | ICD-10-CM | POA: Diagnosis not present

## 2023-04-15 DIAGNOSIS — K8020 Calculus of gallbladder without cholecystitis without obstruction: Secondary | ICD-10-CM | POA: Diagnosis not present

## 2023-04-15 DIAGNOSIS — Z79899 Other long term (current) drug therapy: Secondary | ICD-10-CM | POA: Diagnosis not present

## 2023-04-15 DIAGNOSIS — D72829 Elevated white blood cell count, unspecified: Secondary | ICD-10-CM | POA: Insufficient documentation

## 2023-04-15 DIAGNOSIS — M7989 Other specified soft tissue disorders: Secondary | ICD-10-CM | POA: Diagnosis not present

## 2023-04-15 DIAGNOSIS — R0789 Other chest pain: Secondary | ICD-10-CM | POA: Diagnosis present

## 2023-04-15 DIAGNOSIS — E785 Hyperlipidemia, unspecified: Secondary | ICD-10-CM | POA: Insufficient documentation

## 2023-04-15 DIAGNOSIS — R0602 Shortness of breath: Secondary | ICD-10-CM | POA: Diagnosis not present

## 2023-04-15 DIAGNOSIS — R079 Chest pain, unspecified: Secondary | ICD-10-CM

## 2023-04-15 LAB — COMPREHENSIVE METABOLIC PANEL WITH GFR
ALT: 59 U/L — ABNORMAL HIGH (ref 0–44)
AST: 53 U/L — ABNORMAL HIGH (ref 15–41)
Albumin: 4.1 g/dL (ref 3.5–5.0)
Alkaline Phosphatase: 69 U/L (ref 38–126)
Anion gap: 12 (ref 5–15)
BUN: 46 mg/dL — ABNORMAL HIGH (ref 8–23)
CO2: 23 mmol/L (ref 22–32)
Calcium: 9.5 mg/dL (ref 8.9–10.3)
Chloride: 103 mmol/L (ref 98–111)
Creatinine, Ser: 1.71 mg/dL — ABNORMAL HIGH (ref 0.44–1.00)
GFR, Estimated: 27 mL/min — ABNORMAL LOW (ref >60)
Glucose, Bld: 137 mg/dL — ABNORMAL HIGH (ref 70–99)
Potassium: 4 mmol/L (ref 3.5–5.1)
Sodium: 138 mmol/L (ref 135–145)
Total Bilirubin: 0.9 mg/dL (ref 0.0–1.2)
Total Protein: 7.1 g/dL (ref 6.5–8.1)

## 2023-04-15 LAB — CBC WITH DIFFERENTIAL/PLATELET
Abs Immature Granulocytes: 0.04 10*3/uL (ref 0.00–0.07)
Basophils Absolute: 0.1 10*3/uL (ref 0.0–0.1)
Basophils Relative: 1 %
Eosinophils Absolute: 0.1 10*3/uL (ref 0.0–0.5)
Eosinophils Relative: 1 %
HCT: 36.7 % (ref 36.0–46.0)
Hemoglobin: 12.4 g/dL (ref 12.0–15.0)
Immature Granulocytes: 0 %
Lymphocytes Relative: 15 %
Lymphs Abs: 1.5 10*3/uL (ref 0.7–4.0)
MCH: 31.2 pg (ref 26.0–34.0)
MCHC: 33.8 g/dL (ref 30.0–36.0)
MCV: 92.2 fL (ref 80.0–100.0)
Monocytes Absolute: 1 10*3/uL (ref 0.1–1.0)
Monocytes Relative: 9 %
Neutro Abs: 7.9 10*3/uL — ABNORMAL HIGH (ref 1.7–7.7)
Neutrophils Relative %: 74 %
Platelets: 260 10*3/uL (ref 150–400)
RBC: 3.98 MIL/uL (ref 3.87–5.11)
RDW: 14.3 % (ref 11.5–15.5)
WBC: 10.6 10*3/uL — ABNORMAL HIGH (ref 4.0–10.5)
nRBC: 0 % (ref 0.0–0.2)

## 2023-04-15 LAB — RESP PANEL BY RT-PCR (RSV, FLU A&B, COVID)  RVPGX2
Influenza A by PCR: NEGATIVE
Influenza B by PCR: NEGATIVE
Resp Syncytial Virus by PCR: NEGATIVE
SARS Coronavirus 2 by RT PCR: NEGATIVE

## 2023-04-15 LAB — LIPASE, BLOOD: Lipase: 25 U/L (ref 11–51)

## 2023-04-15 LAB — TROPONIN I (HIGH SENSITIVITY)
Troponin I (High Sensitivity): 28 ng/L — ABNORMAL HIGH (ref <18)
Troponin I (High Sensitivity): 31 ng/L — ABNORMAL HIGH (ref <18)

## 2023-04-15 LAB — BRAIN NATRIURETIC PEPTIDE: B Natriuretic Peptide: 504.4 pg/mL — ABNORMAL HIGH (ref 0.0–100.0)

## 2023-04-15 MED ORDER — ONDANSETRON HCL 4 MG/2ML IJ SOLN
4.0000 mg | Freq: Once | INTRAMUSCULAR | Status: AC
  Administered 2023-04-15: 4 mg via INTRAVENOUS
  Filled 2023-04-15: qty 2

## 2023-04-15 MED ORDER — ASPIRIN 81 MG PO CHEW
324.0000 mg | CHEWABLE_TABLET | Freq: Once | ORAL | Status: AC
  Administered 2023-04-15: 324 mg via ORAL
  Filled 2023-04-15: qty 4

## 2023-04-15 NOTE — ED Provider Notes (Signed)
 [facility] Provider Note    Event Date/Time   First MD Initiated Contact with Patient 04/15/23 [phone]     (approximate)   History   Chest Pain   HPI  Victoria Marquez is a 89 y.o. female with history of hypertension, hyperlipidemia, CKD who comes in with chest pain.  I reviewed the cardiology note from 02/19/2023 with Dr. Melton Alar.  Patient was having some chest pain with activity and improved with rest and they got an echocardiogram.  Patient's echocardiogram on 2/26 with a EF of greater than 55% I also reviewed a note from 3/21 where she had been experiencing some nausea and weakness for about a week with some vomiting and upper abdominal discomfort and she had an ultrasound done of her abdomen that showed cholelithiasis without evidence of cholecystitis.  Patient states that yesterday she just felt a little bit more weak than normal.  Today around 4 AM she developed some chest palpitations and some chest pressure that was up into her shoulders and into her arms.  She stated that the pain resolved after about 2 hours on its own by the time that she was evaluated at her living facility the pain had subsided.  She denied ever having any pain in her abdomen.  The pain was in her chest.  She denies any current pain.  This chest pressure was associated with a little bit of shortness of breath and a little bit of dry heaving.  She does report some baseline swelling in her legs secondary to amlodipine.  She reports that she is otherwise a very healthy.   Physical Exam   Triage Vital Signs: ED Triage Vitals  Encounter Vitals Group     BP 04/15/23 0744 (!) 157/78     Systolic BP Percentile --      Diastolic BP Percentile --      Pulse Rate 04/15/23 0743 84     Resp 04/15/23 0743 17     Temp 04/15/23 0746 98.2 F (36.8 C)     Temp Source 04/15/23 0746 Oral     SpO2 04/15/23 0743 98 %     Weight --      Height --      Head Circumference --      Peak Flow --       Pain Score --      Pain Loc --      Pain Education --      Exclude from Growth Chart --     Most recent vital signs: Vitals:   04/15/23 0744 04/15/23 0746  BP: (!) 157/78   Pulse:    Resp:    Temp:  98.2 F (36.8 C)  SpO2:       General: Awake, no distress.  CV:  Good peripheral perfusion.  Resp:  Normal effort.  Abd:  No distention.  Soft nontender Other:  Trace edema noted bilaterally.  No calf tenderness   ED Results / Procedures / Treatments   Labs (all labs ordered are listed, but only abnormal results are displayed) Labs Reviewed  RESP PANEL BY RT-PCR (RSV, FLU A&B, COVID)  [id number]  CBC WITH DIFFERENTIAL/PLATELET  COMPREHENSIVE METABOLIC PANEL WITH GFR  LIPASE, BLOOD  BRAIN NATRIURETIC PEPTIDE  TROPONIN I (HIGH SENSITIVITY)     EKG  My interpretation of EKG:  Normal sinus rate of 78 without any ST elevation, no T wave inversions, normal intervals  Normal sinus rate of 78 without any ST elevation or  T wave inversions, normal intervals  RADIOLOGY I have reviewed the xray personally and interpreted no pneumonia  IMPRESSION: Minimal bibasilar subsegmental atelectasis or scarring.   PROCEDURES:  Critical Care performed: No  .1-3 Lead EKG Interpretation  Performed by: Concha Se, MD Authorized by: Concha Se, MD     Interpretation: normal     ECG rate:  80   ECG rate assessment: normal     Rhythm: sinus rhythm     Ectopy: none     Conduction: normal      MEDICATIONS ORDERED IN ED: Medications  ondansetron (ZOFRAN) injection 4 mg (4 mg Intravenous Given 04/15/23 0820)  aspirin chewable tablet 324 mg (324 mg Oral Given 04/15/23 0819)     IMPRESSION / MDM / ASSESSMENT AND PLAN / ED COURSE  I reviewed the triage vital signs and the nursing notes.   Patient's presentation is most consistent with acute presentation with potential threat to life or bodily function.      INITIAL IMPRESSION / ASSESSMENT AND PLAN / ED COURSE   Most  Likely DDx:  -Consider ACS vs MSK Vs Genella Rife- will get EKG/troponin to evaluate for ACS - will get lfts to make sure no choledocholithiasis although patient denies there being any abdominal pain and that she does have known gallstones I do not feel that she is got cholecystitis.  She is afebrile and nontender in her upper abdomen.  DDx that was also considered d/t potential to cause harm, but was found less likely based on history and physical (as detailed above): -PNA (no fevers, cough but CXR to evaluate) -PNX (reassured with equal b/l breath sounds, CXR to evaluate) -Symptomatic anemia (will get H&H) -Pulmonary embolism as no sob at rest, not pleuritic in nature, no hypoxia -Aortic Dissection as no tearing pain and no radiation to the mid back, pulses equal, no murmur -Pericarditis no EKG changes or hx to suggest dx -Tamponade (no notable SOB, tachycardic, hypotensive) -Esophageal rupture (no h/o diffuse vomitting/no crepitus)   Pt Lfts slightly elevated but similar to 3/21 44/77 COVID test is negative.  CBC shows slightly elevated white count does not meet sepsis criteria unlikely severe infection.  Her creatinine is up to 1.7 with a baseline of 1.1.  Her troponin is slightly elevated at 28 but this could be from her AKI.  Will get a second troponin to further evaluate.   9:08 AM reevaluated patient abdomen is soft and nontender.  She reports resolution of nausea.  We discussed getting a second troponin and she expressed understanding and has no symptoms at this time  11:37 AM patient troponins are stable but slightly elevated.  Discussed with patient different options including admission for cardiac monitoring, consideration of stress test discussion with cardiology versus going home.  We have opted discussed the case with Dr. Darrold Junker who reviewed patient's blood work and did not recommend stress testing for patient.  He felt like this can be dealt with outpatient given she has resolution of  symptoms troponins can just be elevated from CKD and given the risk of catheterizations and false positive stress test he would not want to jump to doing these kinds of interventions given patient's age and reassuring troponins.  I discussed with patient the different options and she would prefer to go home given this information.  She is a DNR, so even if she was kept here on the monitor and her heart would stop she would not want resuscitation.  We discussed Imdur that cardiology  mention that we could start daily but her blood pressures here are 115/80 and she would prefer not to start any other medications I will follow-up with her cardiologist.  We discussed the possibility that some of this could be related to the gallbladder and biliary colic and she did report eating some spicy foods yesterday.  We discussed foods to avoid with her gallbladder and again she is got no right upper quadrant tenderness or fevers to suggest it being infected so this seems less likely to be cholecystitis.  No evidence of choledocholithiasis based upon blood work.   At this time given re-assuring workup I have considered CT imaging to rule out PE/Dissection but given history and physical exam these seem less likely and potential harm from CT would outweigh the probability of finding PE or Dissection especially given pt has had resolutions of pain.   Discussed with patient that I can not predict future heart attacks and that cardiology can evaluate for need for further workup including stress test.  Explained to patient that if there is a change in symptoms, worsening symptoms, or any other concerns that they should return for repeat evaluation to have repeat EKG/troponin. They expressed understanding.  ____________________________________________   Note:  This document was prepared using Dragon voice recognition software and may include unintentional dictation errors.   The patient is on the cardiac monitor to evaluate  for evidence of arrhythmia and/or significant heart rate changes.      FINAL CLINICAL IMPRESSION(S) / ED DIAGNOSES   Final diagnoses:  Chest pain, unspecified type  Gallstones     Rx / DC Orders   ED Discharge Orders     None        Note:  This document was prepared using Dragon voice recognition software and may include unintentional dictation errors.   Concha Se, MD 04/15/23 [phone]

## 2023-04-15 NOTE — Discharge Instructions (Addendum)
 We discussed with cardiology whether or not admission or discharge and after discussion with you and cardiology we have opted to go home however we cannot predict future heart problems and if you develop return of symptoms she should return for repeat evaluation.  Otherwise she should call your cardiologist to make a follow-up appointment as soon as possible.  You discussed with them starting Imdur to help with chest pain if you continue to have intermittent episodes of chest pain.

## 2023-04-15 NOTE — ED Notes (Signed)
EDP at bedside. Daughter at bedside  

## 2023-04-15 NOTE — ED Triage Notes (Signed)
 Pt in via [facility] from Independent Living at [facility]. C/o chest pressure and bilaterial arm pain into the shoulder and neck that started about 2hrs ago. Pt is A&Ox4. No meds given per EMS. Per pt she was feeling weak yesterday. Resolution of both weakness and chest pain currently.  Vitals per EMS: BP144/58 95%  HR-70's

## 2023-04-16 ENCOUNTER — Other Ambulatory Visit: Payer: Self-pay

## 2023-04-16 ENCOUNTER — Emergency Department

## 2023-04-16 ENCOUNTER — Emergency Department
Admission: EM | Admit: 2023-04-16 | Discharge: 2023-04-16 | Disposition: A | Discharge status: Home / Self Care | Attending: Emergency Medicine | Admitting: Emergency Medicine

## 2023-04-16 ENCOUNTER — Encounter: Payer: Self-pay | Admitting: Emergency Medicine

## 2023-04-16 DIAGNOSIS — R0789 Other chest pain: Secondary | ICD-10-CM | POA: Diagnosis present

## 2023-04-16 DIAGNOSIS — I129 Hypertensive chronic kidney disease with stage 1 through stage 4 chronic kidney disease, or unspecified chronic kidney disease: Secondary | ICD-10-CM | POA: Diagnosis not present

## 2023-04-16 DIAGNOSIS — R002 Palpitations: Secondary | ICD-10-CM | POA: Diagnosis not present

## 2023-04-16 DIAGNOSIS — N189 Chronic kidney disease, unspecified: Secondary | ICD-10-CM | POA: Insufficient documentation

## 2023-04-16 LAB — BASIC METABOLIC PANEL WITH GFR
Anion gap: 11 (ref 5–15)
BUN: 38 mg/dL — ABNORMAL HIGH (ref 8–23)
CO2: 23 mmol/L (ref 22–32)
Calcium: 9.6 mg/dL (ref 8.9–10.3)
Chloride: 109 mmol/L (ref 98–111)
Creatinine, Ser: 1.27 mg/dL — ABNORMAL HIGH (ref 0.44–1.00)
GFR, Estimated: 38 mL/min — ABNORMAL LOW (ref >60)
Glucose, Bld: 128 mg/dL — ABNORMAL HIGH (ref 70–99)
Potassium: 3.8 mmol/L (ref 3.5–5.1)
Sodium: 143 mmol/L (ref 135–145)

## 2023-04-16 LAB — TROPONIN I (HIGH SENSITIVITY)
Troponin I (High Sensitivity): 37 ng/L — ABNORMAL HIGH (ref <18)
Troponin I (High Sensitivity): 34 ng/L — ABNORMAL HIGH (ref <18)

## 2023-04-16 LAB — CBC
HCT: 35.8 % — ABNORMAL LOW (ref 36.0–46.0)
Hemoglobin: 12 g/dL (ref 12.0–15.0)
MCH: 31.8 pg (ref 26.0–34.0)
MCHC: 33.5 g/dL (ref 30.0–36.0)
MCV: 95 fL (ref 80.0–100.0)
Platelets: 259 10*3/uL (ref 150–400)
RBC: 3.77 MIL/uL — ABNORMAL LOW (ref 3.87–5.11)
RDW: 14.2 % (ref 11.5–15.5)
WBC: 9.3 10*3/uL (ref 4.0–10.5)
nRBC: 0 % (ref 0.0–0.2)

## 2023-04-16 NOTE — ED Notes (Signed)
 ED Provider at bedside.

## 2023-04-16 NOTE — ED Notes (Signed)
 Pt refusing xray at this time, reports she had one yesterday morning when she came in for same (CP), would like to speak with provider before having another xray.

## 2023-04-16 NOTE — ED Notes (Signed)
 [facility] transport called at this time for a ride back to independent living.

## 2023-04-16 NOTE — ED Notes (Signed)
Pt up to bedside commode w/standby assist.

## 2023-04-16 NOTE — ED Triage Notes (Signed)
 Pt in via [facility] from [facility] independent living with central chest pressure and palpitations that woke her up within the hour. Seen yesterday for same, arrives currently without pain. Denies any sob or nausea VS with EMS: 157/89 82HR 98%RA 20RR

## 2023-04-16 NOTE — ED Provider Notes (Signed)
 [facility] Provider Note    Event Date/Time   First MD Initiated Contact with Patient 04/16/23 0421     (approximate)   History   Chest Pain   HPI Victoria Marquez is a 89 y.o. female with a past medical history that includes hypertension, hyperlipidemia, CKD, and cholelithiasis without evidence of cholecystitis.  Cardiologist is Dr. Melton Alar.  She presents for evaluation of some palpitations and some discomfort in her upper chest associated with nausea and some vomiting.  This is very similar to her presentation yesterday during the night and yesterday morning that caused her to come to the emergency department about 24 hours ago for evaluation.  Of note, she was seen by her cardiologist about 2 months ago for some chest pain associate with activity and had an echocardiogram that was reassuring.  She also had a note on March 21 (approximately 2 to 3 weeks ago where she had been experiencing some nausea and weakness for about a week with some vomiting and that was when an ultrasound was performed to identify her gallstones.  The patient said that she had another episode a few hours ago of similar symptoms including the upper abdominal and/or chest discomfort.  She also felt some palpitations.  Of note, she states that the symptoms completely went away, but after they were gone she decided that she would call 911 and come in in case they happened again but she has been asymptomatic since before she called 911.  She apologized to me for that it said that she should not have come in at all.  She is currently asymptomatic including no nausea no abdominal pain.  She has had no shortness of breath.  She has been compliant with her medications.  She lives in the assisted living side of her facility and said that she is generally quite healthy and active for her age.     Physical Exam   Triage Vital Signs: ED Triage Vitals [04/16/23 0339]  Encounter Vitals Group     BP  (!) 142/84     Systolic BP Percentile      Diastolic BP Percentile      Pulse Rate 71     Resp 18     Temp 98.2 F (36.8 C)     Temp Source Oral     SpO2 94 %     Weight 63.5 kg (140 lb)     Height      Head Circumference      Peak Flow      Pain Score 0     Pain Loc      Pain Education      Exclude from Growth Chart     Most recent vital signs: Vitals:   04/16/23 0339 04/16/23 0636  BP: (!) 142/84 (!) 123/54  Pulse: 71 74  Resp: 18 18  Temp: 98.2 F (36.8 C) 98.2 F (36.8 C)  SpO2: 94% 98%    General: Awake, no distress.  Appears younger than chronological age.  Alert and oriented. CV:  Good peripheral perfusion.  Normal heart sounds, regular rate and rhythm. Resp:  Normal effort. Speaking easily and comfortably, no accessory muscle usage nor intercostal retractions.  Lungs are clear to auscultation. Abd:  No distention.  No tenderness to palpation including in the epigastrium and right upper quadrant.  No pulsatile abdominal masses.   ED Results / Procedures / Treatments   Labs (all labs ordered are listed, but only abnormal results  are displayed) Labs Reviewed  BASIC METABOLIC PANEL WITH GFR - Abnormal; Notable for the following components:      Result Value   Glucose, Bld 128 (*)    BUN 38 (*)    Creatinine, Ser 1.27 (*)    GFR, Estimated 38 (*)    All other components within normal limits  CBC - Abnormal; Notable for the following components:   RBC 3.77 (*)    HCT 35.8 (*)    All other components within normal limits  TROPONIN I (HIGH SENSITIVITY) - Abnormal; Notable for the following components:   Troponin I (High Sensitivity) 37 (*)    All other components within normal limits  TROPONIN I (HIGH SENSITIVITY) - Abnormal; Notable for the following components:   Troponin I (High Sensitivity) 34 (*)    All other components within normal limits     EKG  ED ECG REPORT I, Loleta Rose, the attending physician, personally viewed and interpreted this  ECG.  Date: 04/16/2023 EKG Time: 3:37 AM Rate: 71 Rhythm: Sinus rhythm with first-degree AV block QRS Axis: normal Intervals: Abnormal due to PR interval of 234 ms ST/T Wave abnormalities: Non-specific ST segment / T-wave changes, but no clear evidence of acute ischemia. Narrative Interpretation: no definitive evidence of acute ischemia; does not meet STEMI criteria.    RADIOLOGY I viewed the patient's two-view chest x-ray from yesterday's ED visit and I see no evidence of widened mediastinum or lobar pneumonia.  Radiology reports some chronic scarring but no acute changes.   PROCEDURES:  Critical Care performed: No  .1-3 Lead EKG Interpretation  Performed by: Loleta Rose, MD Authorized by: Loleta Rose, MD     Interpretation: normal     ECG rate:  70   ECG rate assessment: normal     Rhythm: sinus rhythm     Ectopy: none     Conduction: normal       IMPRESSION / MDM / ASSESSMENT AND PLAN / ED COURSE  I reviewed the triage vital signs and the nursing notes.                              Differential diagnosis includes, but is not limited to, angina, referred GI pain such as biliary colic or acid reflux, ACS, PE, pneumonia.  Patient's presentation is most consistent with acute presentation with potential threat to life or bodily function.  Labs/studies ordered: High-sensitivity troponin x 2, basic metabolic panel, CBC  Interventions/Medications given:  Medications - No data to display  (Note:  hospital course my include additional interventions and/or labs/studies not listed above.)   Patient is well-appearing in no distress and has been asymptomatic for hours.  Vital signs are normal other than some hypertension.  EKG shows no evidence of ischemia.  Initial high-sensitivity troponin is 37 which is up just slightly from the prior tests but not substantially so.  CBC is normal.  Basic metabolic panel is reassuring with an improvement of her creatinine from prior,  and now at 1.27 (previously was 1.71).  The patient is on the cardiac monitor to evaluate for evidence of arrhythmia and/or significant heart rate changes.  She is asymptomatic and would like to go home.  Given the very slight elevation of her troponin, I think it is reasonable to check a second high-sensitivity troponin and as long as she is asymptomatic and not having any significant change in the troponin, she should be appropriate for discharge.  She has outpatient follow-up available, and she is unlikely to benefit from aggressive cardiac intervention.  Dr. Darrold Junker was consulted by phone by the ED provider yesterday and agreed with the plan yesterday so it is very likely we will continue with same plan today.  Patient understands and agrees and is eager to go back home.  Clinical Course as of 04/16/23 0741  Mon Apr 16, 2023  0739 Troponin I (High Sensitivity)(!): 34 Stable and in fact slightly improved high-sensitivity troponin, consistent with prior.  Appropriate for discharge.  Patient is asymptomatic.  Usual and customary return precautions [CF]    Clinical Course User Index [CF] Loleta Rose, MD     FINAL CLINICAL IMPRESSION(S) / ED DIAGNOSES   Final diagnoses:  Atypical chest pain     Rx / DC Orders   ED Discharge Orders          Ordered    Ambulatory referral to Cardiology       Comments: If you have not heard from the Cardiology office within the next 72 hours please call [phone].   04/16/23 5784             Note:  This document was prepared using Dragon voice recognition software and may include unintentional dictation errors.   Loleta Rose, MD 04/16/23 [phone]

## 2023-04-16 NOTE — Discharge Instructions (Addendum)
 Your workup in the Emergency Department today was reassuring.  We did not find any specific abnormalities.  We recommend you drink plenty of fluids, take your regular medications and/or any new ones prescribed today, and follow up with the doctor(s) listed in these documents as recommended.  Return to the Emergency Department if you develop new or worsening symptoms that concern you.

## 2023-06-12 ENCOUNTER — Inpatient Hospital Stay: Admit: 2023-06-12 | Discharge: 2023-06-12 | Disposition: A | Discharge status: Home / Self Care

## 2023-06-12 ENCOUNTER — Inpatient Hospital Stay
Admission: EM | Admit: 2023-06-12 | Discharge: 2023-06-14 | DRG: 277 | Disposition: A | Discharge status: Skilled Nursing Facility | Source: Skilled Nursing Facility | Attending: Hospitalist | Admitting: Hospitalist

## 2023-06-12 ENCOUNTER — Emergency Department

## 2023-06-12 ENCOUNTER — Other Ambulatory Visit: Payer: Self-pay

## 2023-06-12 DIAGNOSIS — Z860100 Personal history of colon polyps, unspecified: Secondary | ICD-10-CM | POA: Diagnosis not present

## 2023-06-12 DIAGNOSIS — R1110 Vomiting, unspecified: Secondary | ICD-10-CM | POA: Diagnosis present

## 2023-06-12 DIAGNOSIS — R0789 Other chest pain: Secondary | ICD-10-CM | POA: Diagnosis present

## 2023-06-12 DIAGNOSIS — I492 Junctional premature depolarization: Secondary | ICD-10-CM | POA: Diagnosis present

## 2023-06-12 DIAGNOSIS — Z79899 Other long term (current) drug therapy: Secondary | ICD-10-CM

## 2023-06-12 DIAGNOSIS — Z66 Do not resuscitate: Secondary | ICD-10-CM | POA: Diagnosis present

## 2023-06-12 DIAGNOSIS — R079 Chest pain, unspecified: Secondary | ICD-10-CM

## 2023-06-12 DIAGNOSIS — J449 Chronic obstructive pulmonary disease, unspecified: Secondary | ICD-10-CM | POA: Diagnosis present

## 2023-06-12 DIAGNOSIS — E871 Hypo-osmolality and hyponatremia: Secondary | ICD-10-CM | POA: Diagnosis present

## 2023-06-12 DIAGNOSIS — R7989 Other specified abnormal findings of blood chemistry: Secondary | ICD-10-CM | POA: Diagnosis present

## 2023-06-12 DIAGNOSIS — Z885 Allergy status to narcotic agent status: Secondary | ICD-10-CM | POA: Diagnosis not present

## 2023-06-12 DIAGNOSIS — K219 Gastro-esophageal reflux disease without esophagitis: Secondary | ICD-10-CM | POA: Diagnosis present

## 2023-06-12 DIAGNOSIS — N1832 Chronic kidney disease, stage 3b: Secondary | ICD-10-CM | POA: Diagnosis present

## 2023-06-12 DIAGNOSIS — Z87891 Personal history of nicotine dependence: Secondary | ICD-10-CM

## 2023-06-12 DIAGNOSIS — I129 Hypertensive chronic kidney disease with stage 1 through stage 4 chronic kidney disease, or unspecified chronic kidney disease: Secondary | ICD-10-CM | POA: Diagnosis present

## 2023-06-12 DIAGNOSIS — I2510 Atherosclerotic heart disease of native coronary artery without angina pectoris: Secondary | ICD-10-CM | POA: Diagnosis present

## 2023-06-12 DIAGNOSIS — Z7983 Long term (current) use of bisphosphonates: Secondary | ICD-10-CM | POA: Diagnosis not present

## 2023-06-12 DIAGNOSIS — I495 Sick sinus syndrome: Principal | ICD-10-CM | POA: Diagnosis present

## 2023-06-12 DIAGNOSIS — Z7982 Long term (current) use of aspirin: Secondary | ICD-10-CM

## 2023-06-12 DIAGNOSIS — N179 Acute kidney failure, unspecified: Secondary | ICD-10-CM | POA: Diagnosis present

## 2023-06-12 DIAGNOSIS — R0902 Hypoxemia: Secondary | ICD-10-CM | POA: Diagnosis present

## 2023-06-12 DIAGNOSIS — E785 Hyperlipidemia, unspecified: Secondary | ICD-10-CM | POA: Diagnosis present

## 2023-06-12 DIAGNOSIS — R55 Syncope and collapse: Secondary | ICD-10-CM | POA: Diagnosis present

## 2023-06-12 DIAGNOSIS — R001 Bradycardia, unspecified: Secondary | ICD-10-CM | POA: Diagnosis present

## 2023-06-12 LAB — COMPREHENSIVE METABOLIC PANEL WITH GFR
ALT: 34 U/L (ref 0–44)
AST: 44 U/L — ABNORMAL HIGH (ref 15–41)
Albumin: 3.6 g/dL (ref 3.5–5.0)
Alkaline Phosphatase: 44 U/L (ref 38–126)
Anion gap: 11 (ref 5–15)
BUN: 45 mg/dL — ABNORMAL HIGH (ref 8–23)
CO2: 19 mmol/L — ABNORMAL LOW (ref 22–32)
Calcium: 8.6 mg/dL — ABNORMAL LOW (ref 8.9–10.3)
Chloride: 94 mmol/L — ABNORMAL LOW (ref 98–111)
Creatinine, Ser: 1.51 mg/dL — ABNORMAL HIGH (ref 0.44–1.00)
GFR, Estimated: 31 mL/min — ABNORMAL LOW (ref >60)
Glucose, Bld: 116 mg/dL — ABNORMAL HIGH (ref 70–99)
Potassium: 4.2 mmol/L (ref 3.5–5.1)
Sodium: 124 mmol/L — ABNORMAL LOW (ref 135–145)
Total Bilirubin: 0.9 mg/dL (ref 0.0–1.2)
Total Protein: 6.3 g/dL — ABNORMAL LOW (ref 6.5–8.1)

## 2023-06-12 LAB — BLOOD GAS, VENOUS
Acid-Base Excess: 0 mmol/L (ref 0.0–2.0)
Bicarbonate: 24.8 mmol/L (ref 20.0–28.0)
O2 Saturation: 85.9 %
Patient temperature: 37
pCO2, Ven: 40 mmHg — ABNORMAL LOW (ref 44–60)
pH, Ven: 7.4 (ref 7.25–7.43)
pO2, Ven: 52 mmHg — ABNORMAL HIGH (ref 32–45)

## 2023-06-12 LAB — CBC
HCT: 35.1 % — ABNORMAL LOW (ref 36.0–46.0)
Hemoglobin: 12 g/dL (ref 12.0–15.0)
MCH: 31.5 pg (ref 26.0–34.0)
MCHC: 34.2 g/dL (ref 30.0–36.0)
MCV: 92.1 fL (ref 80.0–100.0)
Platelets: 253 10*3/uL (ref 150–400)
RBC: 3.81 MIL/uL — ABNORMAL LOW (ref 3.87–5.11)
RDW: 14.5 % (ref 11.5–15.5)
WBC: 10.7 10*3/uL — ABNORMAL HIGH (ref 4.0–10.5)
nRBC: 0 % (ref 0.0–0.2)

## 2023-06-12 LAB — ECHOCARDIOGRAM COMPLETE
AR max vel: 2.15 cm2
AV Area VTI: 2.48 cm2
AV Area mean vel: 2.21 cm2
AV Mean grad: 6 mmHg
AV Peak grad: 9.9 mmHg
Ao pk vel: 1.58 m/s
Area-P 1/2: 2.85 cm2
MV VTI: 1.94 cm2
S' Lateral: 2.6 cm
Weight: 1873.03 [oz_av]

## 2023-06-12 LAB — T4, FREE: Free T4: 1.17 ng/dL — ABNORMAL HIGH (ref 0.61–1.12)

## 2023-06-12 LAB — LACTIC ACID, PLASMA: Lactic Acid, Venous: 1.4 mmol/L (ref 0.5–1.9)

## 2023-06-12 LAB — TROPONIN I (HIGH SENSITIVITY)
Troponin I (High Sensitivity): 16 ng/L (ref <18)
Troponin I (High Sensitivity): 17 ng/L (ref <18)

## 2023-06-12 LAB — MRSA NEXT GEN BY PCR, NASAL: MRSA by PCR Next Gen: NOT DETECTED

## 2023-06-12 LAB — D-DIMER, QUANTITATIVE: D-Dimer, Quant: 0.53 ug{FEU}/mL — ABNORMAL HIGH (ref 0.00–0.50)

## 2023-06-12 LAB — MAGNESIUM: Magnesium: 2.1 mg/dL (ref 1.7–2.4)

## 2023-06-12 LAB — BRAIN NATRIURETIC PEPTIDE: B Natriuretic Peptide: 479 pg/mL — ABNORMAL HIGH (ref 0.0–100.0)

## 2023-06-12 LAB — GLUCOSE, CAPILLARY: Glucose-Capillary: 120 mg/dL — ABNORMAL HIGH (ref 70–99)

## 2023-06-12 LAB — TSH: TSH: 4.512 u[IU]/mL — ABNORMAL HIGH (ref 0.350–4.500)

## 2023-06-12 MED ORDER — ASPIRIN 81 MG PO CHEW
324.0000 mg | CHEWABLE_TABLET | Freq: Once | ORAL | Status: AC
  Administered 2023-06-12: 324 mg via ORAL
  Filled 2023-06-12: qty 4

## 2023-06-12 MED ORDER — ORAL CARE MOUTH RINSE
15.0000 mL | OROMUCOSAL | Status: DC | PRN
Start: 2023-06-12 — End: 2023-06-14

## 2023-06-12 MED ORDER — HEPARIN SODIUM (PORCINE) 5000 UNIT/ML IJ SOLN
5000.0000 [IU] | Freq: Two times a day (BID) | INTRAMUSCULAR | Status: DC
  Administered 2023-06-12 – 2023-06-14 (×4): 5000 [IU] via SUBCUTANEOUS
  Filled 2023-06-12 (×4): qty 1

## 2023-06-12 MED ORDER — CHLORHEXIDINE GLUCONATE CLOTH 2 % EX PADS
6.0000 | MEDICATED_PAD | Freq: Every day | CUTANEOUS | Status: DC
  Administered 2023-06-12 (×2): 6 via TOPICAL

## 2023-06-12 MED ORDER — LORATADINE 10 MG PO TABS
10.0000 mg | ORAL_TABLET | Freq: Every day | ORAL | Status: DC
  Administered 2023-06-12 – 2023-06-14 (×2): 10 mg via ORAL
  Filled 2023-06-12 (×3): qty 1

## 2023-06-12 MED ORDER — DOPAMINE-DEXTROSE 3.2-5 MG/ML-% IV SOLN
5.0000 ug/kg/min | INTRAVENOUS | Status: DC
  Administered 2023-06-12: 5 ug/kg/min via INTRAVENOUS

## 2023-06-12 MED ORDER — CALCIUM GLUCONATE 10 % IV SOLN
1.0000 g | Freq: Once | INTRAVENOUS | Status: AC
  Administered 2023-06-12: 1 g via INTRAVENOUS
  Filled 2023-06-12: qty 10

## 2023-06-12 MED ORDER — ONDANSETRON HCL 4 MG/2ML IJ SOLN: 4.0000 mg | Freq: Four times a day (QID) | INTRAMUSCULAR | Status: DC | PRN

## 2023-06-12 MED ORDER — ATROPINE SULFATE 1 MG/10ML IJ SOSY
0.5000 mg | PREFILLED_SYRINGE | Freq: Once | INTRAMUSCULAR | Status: AC
  Administered 2023-06-12: 0.5 mg via INTRAVENOUS
  Filled 2023-06-12: qty 10

## 2023-06-12 MED ORDER — DOPAMINE-DEXTROSE 3.2-5 MG/ML-% IV SOLN
5.0000 ug/kg/min | INTRAVENOUS | Status: DC
  Administered 2023-06-12: 5 ug/kg/min via INTRAVENOUS
  Filled 2023-06-12: qty 250

## 2023-06-12 MED ORDER — SENNOSIDES-DOCUSATE SODIUM 8.6-50 MG PO TABS: 1.0000 | ORAL_TABLET | Freq: Every evening | ORAL | Status: DC | PRN

## 2023-06-12 MED ORDER — FLUTICASONE PROPIONATE 50 MCG/ACT NA SUSP
1.0000 | Freq: Every day | NASAL | Status: DC
  Administered 2023-06-12: 1 via NASAL
  Filled 2023-06-12: qty 16

## 2023-06-12 MED ORDER — AZELASTINE HCL 0.1 % NA SOLN: 1.0000 | Freq: Two times a day (BID) | NASAL | Status: DC | PRN

## 2023-06-12 MED ORDER — ONDANSETRON HCL 4 MG/2ML IJ SOLN
INTRAMUSCULAR | Status: AC
  Administered 2023-06-12: 4 mg via INTRAVENOUS
  Filled 2023-06-12: qty 2

## 2023-06-12 NOTE — Progress Notes (Signed)
 Patient admitted to ICU room 3 today. The patient is currently on a Dopamine drip. Titration rate has been adjusted by the hospitalist as the patient's heart rate is sensitive to  the medication. Current titration rate is increase by 1 mcg/kg/min. The patient has been q2 turns. Consent has been obtained. Son has been at the bedside and is involved in the patient's plan of care. Per Dr. Jeane Miguel the goal is to maintain the patient's heart rate in the 60's. MRSA swab is negative. CHG wipe down has been completed. Pt to be NPO after midnight. The patient has been Aox4 since admission yet communication can be incomprehensible at times.

## 2023-06-12 NOTE — Consult Note (Signed)
 [facility] CLINIC CARDIOLOGY CONSULT NOTE       Patient ID: Victoria Marquez MRN: [id number] DOB/AGE: 88/09/1923 88 y.o.  Admit date: 06/12/2023 Referring Physician Dr. Antoniette Batty Primary Physician Melchor Spoon, MD Primary Cardiologist Dr. Braxton Calico Reason for Consultation symptomatic bradycardia  HPI: Victoria Marquez is a 89 y.o. female  with a past medical history of hypertension and hyperlipemia who presented to the ED on 06/12/2023 for generalized weakness, fatigue. Patient found to have symptomatic bradycardia. EKG in ED revealed junctional escape rhythm. Patient does not take AVN blockers at home. Cardiology was consulted for further evaluation.   Patient presented to the ED with fatigue and generalized weakness and found to have symptomatic bradycardia. Work up in the ED notable for Na 124, K 4.2, Mg 2.1, Cr 1.51, Co2 19, Hgb 12, plts 253, WBC 10.7. CXR with no evidence of acute cardiopulmonary disease. Trops negative x2. EKG in ED revealed junctional escape rhythm with rate in 30s. Patient received 1x of 0.5 mg atropine. Then started on low dose dopamine.   At the time of my evaluation this AM, patient was resting comfortably with family at bedside in ED stretcher. Patient endorses fatigue and generalized weakness. While room patient had an episode of vomiting which is new for the patient. Patient denies chest pain or palpitations.Patient states after starting dopamine she feels a bit better and less fatigued. Patient states she mostly feels weak.  Patient states she lives at home alone. Patient uses her walker with no difficulty. Patient states she goes to grocery store and does most ADLs on her own. Patient states she just doesn't drive.    Review of systems complete and found to be negative unless listed above    Past Medical History:  Diagnosis Date   Actinic keratosis    Anxiety    Cancer (HCC)    Colon polyps    Depression    Hyperglycemia    Hyperlipemia    Osteopenia     Urinary incontinence    Vitamin D deficiency     Past Surgical History:  Procedure Laterality Date   BLADDER SURGERY     EYE SURGERY      (Not in a hospital admission)  Social History   Socioeconomic History   Marital status: Widowed    Spouse name: Not on file   Number of children: Not on file   Years of education: Not on file   Highest education level: Not on file  Occupational History   Not on file  Tobacco Use   Smoking status: Former    Current packs/day: 0.00    Types: Cigarettes    Quit date: 07/22/1990    Years since quitting: 32.9   Smokeless tobacco: Never  Vaping Use   Vaping status: Never Used  Substance and Sexual Activity   Alcohol use: Yes    Alcohol/week: 0.0 standard drinks of alcohol   Drug use: No   Sexual activity: Not Currently  Other Topics Concern   Not on file  Social History Narrative   Not on file   Social Drivers of Health   Financial Resource Strain: Not on file  Food Insecurity: No Food Insecurity (02/13/2023)   Received from [facility]   Hunger Vital Sign    Worried About Running Out of Food in the Last Year: Never true    Ran Out of Food in the Last Year: Never true  Transportation Needs: No Transportation Needs (02/13/2023)   Received  from [facility] - Transportation    In the past 12 months, has lack of transportation kept you from medical appointments or from getting medications?: No    Lack of Transportation (Non-Medical): No  Physical Activity: Not on file  Stress: Not on file  Social Connections: Not on file  Intimate Partner Violence: Not on file    History reviewed. No pertinent family history.   Vitals:   06/12/23 0920 06/12/23 0925 06/12/23 0930 06/12/23 0935  BP:   (!) 99/51   Pulse: (!) 58 (!) 30 (!) 43 (!) 43  Resp:      Temp:      TempSrc:      SpO2: 95% 91% 92% 92%  Weight:        PHYSICAL EXAM General: well appearing elderly female, well nourished, in no  acute distress. HEENT: Normocephalic and atraumatic. Neck: No JVD.   Lungs: Normal respiratory effort on 3L. Clear bilaterally to auscultation. No wheezes, crackles, rhonchi.  Heart: HRR, slow rates. Normal S1 and S2 without gallops or murmurs.  Abdomen: Non-distended appearing.  Msk: Normal strength and tone for age. Extremities: Warm and well perfused. No clubbing, cyanosis. No edema.  Neuro: Alert and oriented X 3. Psych: Answers questions appropriately.   Labs: Basic Metabolic Panel: Recent Labs    06/12/23 0356  NA 124*  K 4.2  CL 94*  CO2 19*  GLUCOSE 116*  BUN 45*  CREATININE 1.51*  CALCIUM 8.6*  MG 2.1   Liver Function Tests: Recent Labs    06/12/23 0356  AST 44*  ALT 34  ALKPHOS 44  BILITOT 0.9  PROT 6.3*  ALBUMIN 3.6   No results for input(s): "LIPASE", "AMYLASE" in the last 72 hours. CBC: Recent Labs    06/12/23 0356  WBC 10.7*  HGB 12.0  HCT 35.1*  MCV 92.1  PLT 253   Cardiac Enzymes: Recent Labs    06/12/23 0356 06/12/23 0621  TROPONINIHS 16 17   BNP: No results for input(s): "BNP" in the last 72 hours. D-Dimer: Recent Labs    06/12/23 0444  DDIMER 0.53*   Hemoglobin A1C: No results for input(s): "HGBA1C" in the last 72 hours. Fasting Lipid Panel: No results for input(s): "CHOL", "HDL", "LDLCALC", "TRIG", "CHOLHDL", "LDLDIRECT" in the last 72 hours. Thyroid Function Tests: Recent Labs    06/12/23 0356  TSH 4.512*   Anemia Panel: No results for input(s): "VITAMINB12", "FOLATE", "FERRITIN", "TIBC", "IRON", "RETICCTPCT" in the last 72 hours.   Radiology: [facility] Chest Port 1 View Result Date: 06/12/2023 CLINICAL DATA:  Chest pain EXAM: PORTABLE CHEST 1 VIEW COMPARISON:  None Available. FINDINGS: The lungs are symmetrically hyperinflated in keeping with changes of underlying COPD. The lungs are clear. No pneumothorax or pleural effusion. Cardiac size is within normal limits. Pulmonary vascularity is normal. Osseous structures are age  appropriate. Healed right proximal humeral fracture. IMPRESSION: 1. No active disease. COPD. Electronically Signed   By: Worthy Heads M.D.   On: 06/12/2023 04:19    ECHO ordered  TELEMETRY reviewed by me 06/12/2023: sinus rhythm rate 70s (junctional escape rhythm -when HR in 30s)  EKG reviewed by me: Junctional escape rhythm, rate 36 bpm  Data reviewed by me 06/12/2023: last 24h vitals tele labs imaging I/O ED provider note, admission H&P.  Principal Problem:   Symptomatic bradycardia Active Problems:   Bradycardia    ASSESSMENT AND PLAN:   Victoria Marquez is a 89 y.o. female  with a  past medical history of hypertension and hyperlipemia who presented to the ED on 06/12/2023 for generalized weakness, fatigue. Patient found to have symptomatic bradycardia. EKG in ED revealed junctional escape rhythm. Patient does not take AVN blockers at home and electrolytes stable. Cardiology was consulted for further evaluation.   # Symptomatic bradycardia # Junctional Escape Rhythm  # Hypertension # Hyperlipidemia Patient presents with symptomatic bradycardia and found to have junctional escape rhythm in 30s. Patient lives alone and performs all ADLs, and walks with walker with no difficuly. Patient received 1x dose 0.5 mg atropine that helped bring up HR to 50s. Patient started on low dose dopamine, HR remains stable on dopamine. Pads in place in ED. BNP elevated at 470. Trops negative x2.  -Echo ordered -Monitor telemetry closely.  -Monitor and replenish electrolytes for a goal K >4, Mag >2  -Coninue low dose dopamine. -Avoid all AVN blockers. -Discussed the risks and benefits of proceeding with PPM for symptomatic bradycardia, junctional escape rhythm. She is agreeable to proceed.  NPO start at midnight until PPM (06/04 @ 7:30 AM) with Dr. Parks Bollman.  Written consent will be obtained.  -Patient's son states patient lives at [facility] and will have 3 days of strict monitoring after procedure.    This patient's plan of care was discussed and created with Dr. Bob Burn and he is in agreement.  Signed: Creighton Doffing, PA-C  06/12/2023, 9:47 AM [facility] Cardiology

## 2023-06-12 NOTE — H&P (Signed)
 History and Physical    Victoria Marquez ZOX:[id number] DOB: 13-May-1923 DOA: 06/12/2023  PCP: Melchor Spoon, MD (Confirm with patient/family/NH records and if not entered, this has to be entered at [facility] point of entry) Patient coming from: Assisted living  I have personally briefly reviewed patient's old medical records in [facility]  Chief Complaint: Chest pain shortness of breath  HPI: Victoria Marquez is a 89 y.o. female with medical history significant of HTN, HLD, GERD, anxiety/depression, presented with chest pain and dizziness.  Patient reported that he started to feel tightness in the chest and shortness of breath last time and could not sleep.  Before that, she has had intermittent lightheadedness recently, which has been very random and not associated with any activity.  She reports a little last week, her blood pressure medication was adjusted and she was started on chlorthalidone and a different ARB as she was only on olmesartan before.  At baseline she is very independent and functional, does everything herself at home.  ED Course: Afebrile, heart rate in the 30s, blood pressure 116/40 O2 saturation 96% on room air.  Chest x-ray negative for acute findings, blood work showed sodium 124 potassium 4.2 BUN 45 creatinine 1.5 compared to baseline 1.2-1.7 and glucose 116.  Patient was given atropine x 1 and started on dopamine drip.  Review of Systems: As per HPI otherwise 14 point review of systems negative.    Past Medical History:  Diagnosis Date   Actinic keratosis    Anxiety    Cancer (HCC)    Colon polyps    Depression    Hyperglycemia    Hyperlipemia    Osteopenia    Urinary incontinence    Vitamin D deficiency     Past Surgical History:  Procedure Laterality Date   BLADDER SURGERY     EYE SURGERY       reports that she quit smoking about 32 years ago. Her smoking use included cigarettes. She has never used smokeless tobacco. She reports current alcohol  use. She reports that she does not use drugs.  Allergies  Allergen Reactions   Codeine Other (See Comments)    Does not remember reaction     History reviewed. No pertinent family history.   Prior to Admission medications   Medication Sig Start Date End Date Taking? Authorizing Provider  alendronate (FOSAMAX) 70 MG tablet  03/22/13  Yes [provider]  amLODipine (NORVASC) 5 MG tablet Take 5 mg by mouth 2 (two) times daily. 07/02/15  Yes [provider]  azelastine (ASTELIN) 0.1 % nasal spray Place into the nose. 10/08/14 06/12/23 Yes [provider]  Calcium Carbonate-Vitamin D 600-200 MG-UNIT TABS Take by mouth.   Yes [provider]  cetirizine  (ZYRTEC ) 5 MG tablet Take 1 tablet (5 mg total) by mouth at bedtime for 5 days. 06/04/21 06/12/23 Yes Buena Carmine, NP  chlorthalidone (HYGROTON) 25 MG tablet Take 12.5 mg by mouth daily. 05/08/23 05/07/24 Yes [provider]  Cholecalciferol 25 MCG (1000 UT) tablet Take 2,000 Units by mouth daily.   Yes [provider]  fluticasone (FLONASE) 50 MCG/ACT nasal spray Place into the nose.   Yes [provider]  Folic Acid 20 MG CAPS Take by mouth.   Yes [provider]  losartan (COZAAR) 100 MG tablet Take 100 mg by mouth daily. 03/22/13  Yes [provider]  Omega-3 Fatty Acids (FISH OIL) 1000 MG CAPS Take by mouth.  Yes [provider]  ondansetron  (ZOFRAN -ODT) 4 MG disintegrating tablet Take 4 mg by mouth every 8 (eight) hours as needed. 03/30/23  Yes [provider]  telmisartan (MICARDIS) 80 MG tablet Take 80 mg by mouth. 05/01/23 04/30/24 Yes [provider]  Trospium Chloride 60 MG CP24 trospium ER 60 mg capsule,extended release 24 hr 04/03/17  Yes [provider]  amoxicillin (AMOXIL) 875 MG tablet Take by mouth. 06/03/21   [provider]  aspirin  EC 81 MG tablet Take 81 mg by mouth daily. Patient not taking: Reported on  06/12/2023 04/02/23 04/01/24  [provider]  Influenza vac split quadrivalent PF (FLUZONE HIGH-DOSE) 0.5 ML injection Fluzone High-Dose 2018-2019 (PF) 180 mcg/0.5 mL intramuscular syringe  TO BE ADMINISTERED BY PHARMACIST FOR IMMUNIZATION    [provider]  pantoprazole (PROTONIX) 40 MG tablet  03/10/13   [provider]  valACYclovir  (VALTREX ) 1000 MG tablet Take 1 tablet (1,000 mg total) by mouth 3 (three) times daily. Patient not taking: Reported on 06/12/2023 06/07/21   Artemio Larry, MD  Zoster Vaccine Adjuvanted [facility]) injection Shingrix (PF) 50 mcg/0.5 mL intramuscular suspension, kit    [provider]    Physical Exam: Vitals:   06/12/23 0925 06/12/23 0930 06/12/23 0935 06/12/23 0945  BP:  (!) 99/51  (!) 148/54  Pulse: (!) 30 (!) 43 (!) 43 (!) 44  Resp:      Temp:      TempSrc:      SpO2: 91% 92% 92% 94%  Weight:        Constitutional: NAD, calm, comfortable Vitals:   06/12/23 0925 06/12/23 0930 06/12/23 0935 06/12/23 0945  BP:  (!) 99/51  (!) 148/54  Pulse: (!) 30 (!) 43 (!) 43 (!) 44  Resp:      Temp:      TempSrc:      SpO2: 91% 92% 92% 94%  Weight:       Eyes: PERRL, lids and conjunctivae normal ENMT: Mucous membranes are moist. Posterior pharynx clear of any exudate or lesions.Normal dentition.  Neck: normal, supple, no masses, no thyromegaly Respiratory: clear to auscultation bilaterally, no wheezing, no crackles. Normal respiratory effort. No accessory muscle use.  Cardiovascular: Regular rate and rhythm, no murmurs / rubs / gallops. No extremity edema. 2+ pedal pulses. No carotid bruits.  Abdomen: no tenderness, no masses palpated. No hepatosplenomegaly. Bowel sounds positive.  Musculoskeletal: no clubbing / cyanosis. No joint deformity upper and lower extremities. Good ROM, no contractures. Normal muscle tone.  Skin: no rashes, lesions, ulcers. No induration Neurologic: CN 2-12 grossly intact. Sensation intact, DTR normal.  Strength 5/5 in all 4.  Psychiatric: Normal judgment and insight. Alert and oriented x 3. Normal mood.     Labs on Admission: I have personally reviewed following labs and imaging studies  CBC: Recent Labs  Lab 06/12/23 0356  WBC 10.7*  HGB 12.0  HCT 35.1*  MCV 92.1  PLT 253   Basic Metabolic Panel: Recent Labs  Lab 06/12/23 0356  NA 124*  K 4.2  CL 94*  CO2 19*  GLUCOSE 116*  BUN 45*  CREATININE 1.51*  CALCIUM 8.6*  MG 2.1   GFR: Estimated Creatinine Clearance: 17.6 mL/min (A) (by C-G formula based on SCr of 1.51 mg/dL (H)). Liver Function Tests: Recent Labs  Lab 06/12/23 0356  AST 44*  ALT 34  ALKPHOS 44  BILITOT 0.9  PROT 6.3*  ALBUMIN 3.6   No results for input(s): "LIPASE", "AMYLASE" in the  last 168 hours. No results for input(s): "AMMONIA" in the last 168 hours. Coagulation Profile: No results for input(s): "INR", "PROTIME" in the last 168 hours. Cardiac Enzymes: No results for input(s): "CKTOTAL", "CKMB", "CKMBINDEX", "TROPONINI" in the last 168 hours. BNP (last 3 results) No results for input(s): "PROBNP" in the last 8760 hours. HbA1C: No results for input(s): "HGBA1C" in the last 72 hours. CBG: No results for input(s): "GLUCAP" in the last 168 hours. Lipid Profile: No results for input(s): "CHOL", "HDL", "LDLCALC", "TRIG", "CHOLHDL", "LDLDIRECT" in the last 72 hours. Thyroid Function Tests: Recent Labs    06/12/23 0356  TSH 4.512*  FREET4 1.17*   Anemia Panel: No results for input(s): "VITAMINB12", "FOLATE", "FERRITIN", "TIBC", "IRON", "RETICCTPCT" in the last 72 hours. Urine analysis: No results found for: "COLORURINE", "APPEARANCEUR", "LABSPEC", "PHURINE", "GLUCOSEU", "HGBUR", "BILIRUBINUR", "KETONESUR", "PROTEINUR", "UROBILINOGEN", "NITRITE", "LEUKOCYTESUR"  Radiological Exams on Admission: DG Chest Port 1 View Result Date: 06/12/2023 CLINICAL DATA:  Chest pain EXAM: PORTABLE CHEST 1 VIEW COMPARISON:  None Available. FINDINGS: The  lungs are symmetrically hyperinflated in keeping with changes of underlying COPD. The lungs are clear. No pneumothorax or pleural effusion. Cardiac size is within normal limits. Pulmonary vascularity is normal. Osseous structures are age appropriate. Healed right proximal humeral fracture. IMPRESSION: 1. No active disease. COPD. Electronically Signed   By: Worthy Heads M.D.   On: 06/12/2023 04:19    EKG: Independently reviewed.  Sinus bradycardia versus junctional rhythm, no acute ST changes.  Assessment/Plan Principal Problem:   Symptomatic bradycardia Active Problems:   Bradycardia  (please populate well all problems here in Problem List. (For example, if patient is on BP meds at home and you resume or decide to hold them, it is a problem that needs to be her. Same for CAD, COPD, HLD and so on)  Symptomatic bradycardia Near syncope -Discussed with cardiology attending at bedside, concerning about sick sinus syndrome, recommend PPM evaluation.  Discussed with patient and her daughter at bedside, all agreed with the plan. - TSH elevated however T4 within normal limits. - Hold off home BP meds  Hyponatremia - Secondary to [place] ago, discontinue chlorthalidone  CKD stage IIIb - Discontinue chlorthalidone due to advanced kidney function.    DVT prophylaxis: Heparin subcu Code Status: DNR Family Communication: Daughter at bedside Disposition Plan: Patient is sick coming with symptomatic bradycardia requiring atropine and inotrope support, and PPM evaluation, expect more than 2 midnight hospital stay Consults called: Cardiology Admission status: Stepdown unit admit   [place] MD [facility] Pager [phone]  06/12/2023, 10:05 AM

## 2023-06-12 NOTE — Progress Notes (Signed)
*  PRELIMINARY RESULTS* Echocardiogram 2D Echocardiogram has been performed.  Victoria Marquez 06/12/2023, 3:26 PM

## 2023-06-12 NOTE — ED Notes (Signed)
 Pt transported on zoll monitor to ICU, no event. Confirmed w/ pt and daughter that she had all of her belongings. ICU RN/secretary notified Pt arrived on unit.

## 2023-06-12 NOTE — ED Notes (Signed)
Informed RN bed assigned 

## 2023-06-12 NOTE — ED Triage Notes (Signed)
 Patient arrives by [facility] from [facility] with initial complaint of chest pressure.  She said it was bad enough to keep her from sleeping.  EMS reports HR of 38 on scene.  Patient denies pressure on arrival to ED.

## 2023-06-12 NOTE — Plan of Care (Signed)

## 2023-06-12 NOTE — ED Provider Notes (Addendum)
 [facility] Provider Note    Event Date/Time   First MD Initiated Contact with Patient 06/12/23 0345     (approximate)   History   Chest Pain   HPI  Victoria Marquez is a 89 y.o. female   Past medical history of hypertension and hyperlipidemia who presents to the emergency department with fatigue, generalized weakness, bouts of shortness of breath and chest pain that are transient and self resolved.  This was started last night.  Currently asymptomatic.  She states that her blood pressure medications were adjusted by her primary doctor about 2 weeks ago.  Otherwise she has been in her regular state of health with no recent illnesses, denies any respiratory infectious symptoms, other changes in medications, p.o. intake has been adequate and voiding appropriately.  She denies any abdominal pain or urinary symptoms.  She denies any falls.  External Medical Documents Reviewed: Cardiology and primary doctor notes from within the last 2 weeks where her ARBs were adjusted, and no notes of any beta-blockade or calcium channel blockers added to her antihypertensive medications      Physical Exam   Triage Vital Signs: ED Triage Vitals  Encounter Vitals Group     BP 06/12/23 0346 122/60     Systolic BP Percentile --      Diastolic BP Percentile --      Pulse Rate 06/12/23 0346 (!) 40     Resp 06/12/23 0346 13     Temp 06/12/23 0346 97.7 F (36.5 C)     Temp Source 06/12/23 0346 Oral     SpO2 06/12/23 0346 100 %     Weight --      Height --      Head Circumference --      Peak Flow --      Pain Score 06/12/23 0344 0     Pain Loc --      Pain Education --      Exclude from Growth Chart --     Most recent vital signs: Vitals:   06/12/23 0600 06/12/23 0630  BP: (!) 106/57 (!) 116/48  Pulse: (!) 32 (!) 38  Resp:    Temp:    SpO2: 97% 97%    General: Awake, no distress.  CV:  Good peripheral perfusion.  Resp:  Normal effort.  Abd:  No  distention.  Other:    She is bradycardic anywhere from the 30s to 40s.  She is normotensive.  She is a pleasant woman conversant and appropriate in no acute distress.  No respiratory distress, breathing comfortably and clear lungs on auscultation to all lung fields.  She appears euvolemic overall with no significant peripheral edema or rales on auscultation of the lungs  ED Results / Procedures / Treatments   Labs (all labs ordered are listed, but only abnormal results are displayed) Labs Reviewed  CBC - Abnormal; Notable for the following components:      Result Value   WBC 10.7 (*)    RBC 3.81 (*)    HCT 35.1 (*)    All other components within normal limits  COMPREHENSIVE METABOLIC PANEL WITH GFR - Abnormal; Notable for the following components:   Sodium 124 (*)    Chloride 94 (*)    CO2 19 (*)    Glucose, Bld 116 (*)    BUN 45 (*)    Creatinine, Ser 1.51 (*)    Calcium 8.6 (*)    Total Protein 6.3 (*)  AST 44 (*)    GFR, Estimated 31 (*)    All other components within normal limits  TSH - Abnormal; Notable for the following components:   TSH 4.512 (*)    All other components within normal limits  T4, FREE - Abnormal; Notable for the following components:   Free T4 1.17 (*)    All other components within normal limits  D-DIMER, QUANTITATIVE - Abnormal; Notable for the following components:   D-Dimer, Quant 0.53 (*)    All other components within normal limits  MAGNESIUM  TROPONIN I (HIGH SENSITIVITY)  TROPONIN I (HIGH SENSITIVITY)     I ordered and reviewed the above labs they are notable for cell counts are unremarkable.  EKG  ED ECG REPORT I, Buell Carmin, the attending physician, personally viewed and interpreted this ECG.   Date: 06/12/2023  EKG Time: 0351  Rate: 36  Rhythm: Junctional versus atrial fibrillation with slow ventricular response  Axis: nl  Intervals:nonspecific intraventricular conduction delay  ST&T Change: No STEMI   RADIOLOGY I  independently reviewed and interpreted chest x-ray and I see no obvious focality pneumothorax I also reviewed radiologist's formal read.   PROCEDURES:  Critical Care performed: Yes, see critical care procedure note(s)  .Critical Care  Performed by: Buell Carmin, MD Authorized by: Buell Carmin, MD   Critical care provider statement:    Critical care time (minutes):  30   Critical care was time spent personally by me on the following activities:  Development of treatment plan with patient or surrogate, discussions with consultants, evaluation of patient's response to treatment, examination of patient, ordering and review of laboratory studies, ordering and review of radiographic studies, ordering and performing treatments and interventions, pulse oximetry, re-evaluation of patient's condition and review of old charts    MEDICATIONS ORDERED IN ED: Medications  DOPamine (INTROPIN) 800 mg in dextrose 5 % 250 mL (3.2 mg/mL) infusion (has no administration in time range)  aspirin  chewable tablet 324 mg (324 mg Oral Given 06/12/23 0404)  calcium gluconate inj 10% (1 g) URGENT USE ONLY! (1 g Intravenous Given 06/12/23 0452)  atropine 1 MG/10ML injection 0.5 mg (0.5 mg Intravenous Given 06/12/23 0981)    External physician / consultants:  I spoke with hospital medicine for admission and regarding care plan for this patient.   IMPRESSION / MDM / ASSESSMENT AND PLAN / ED COURSE  I reviewed the triage vital signs and the nursing notes.                                Patient's presentation is most consistent with acute presentation with potential threat to life or bodily function.  Differential diagnosis includes, but is not limited to, symptomatic bradycardia, atrial fibrillation bradycardia, junctional bradycardia, ischemia, drug-induced, electrolyte disturbance, thyroid abnormality   The patient is on the cardiac monitor to evaluate for evidence of arrhythmia and/or significant heart rate  changes.  MDM:    Patient is experiencing the symptoms of her bradycardia which appear new as her heart rate has been noted to be normal in the past with recent cardiology visits and no changes in medications that should elicit a bradycardic response.  She has intermittent chest pain with shortness of breath but is normal now with no symptoms, considered ischemia but no STEMI on EKG, follow-up with serial troponins give aspirin  now.  Get pacer pads at bedside but will defer on pacing given that at rest she  is completely asymptomatic despite her bradycardia from the 30s to 40s she is normotensive and mentating well.  I considered but doubt PE as she has lower risk factors and bradycardic response would be unusual. However during her treatment course she was noted to become hypoxemic to the 88% while sleeping so put on o2 (and systolic BP to high 80s while sleeping) and although this may be due to some element of sleep apnea, with her chief complaint of intermittent shortness of breath with chest discomfort as well as now hypoxemia, we will proceed with D-dimer for risk stratification.  Low pretest probability  & D-dimer resulted under the age-adjusted threshold, I think PE ruled out in my clinical judgment.  Notably both her oxygen saturations and her blood pressure improved to normal when she is wakeful.  She does not appear to be in complete heart block as her QRS complexes are narrow and there are no discernible P waves on multiple EKGs.  For several EKGs indicate perhaps atrial fibrillation or junctional bradycardia, and then the most recent EKG at 5:04 AM and on cardiac monitoring appear to have P waves before each QRS complexes especially in lead II, more consistent with a sinus bradycardia  Give calcium while electrolytes pending in case of hyperkalemic driven bradycardia (she has had poor kidney function on lab testing in the past.)  Given her notable fatigue, bradycardia, occasional shortness of  breath and chest discomfort I think she should be admitted for symptomatic bradycardia.  I spoke with Dr. Bob Burn of [facility] cardiology who reviewed the patient's case and since she is sustaining in the low 30s recommend a trial of very low-dose 5 mcg dopamine.  Given his pressor requirement I called ICU for admission.     FINAL CLINICAL IMPRESSION(S) / ED DIAGNOSES   Final diagnoses:  Symptomatic bradycardia  Nonspecific chest pain  Hyponatremia     Rx / DC Orders   ED Discharge Orders     None        Note:  This document was prepared using Dragon voice recognition software and may include unintentional dictation errors.       Buell Carmin, MD 06/12/23 Laurena Pong    Buell Carmin, MD 06/12/23 1610    Buell Carmin, MD 06/12/23 Jillian Mott    Buell Carmin, MD 06/12/23 [phone]

## 2023-06-13 ENCOUNTER — Encounter: Payer: Self-pay | Admitting: Cardiology

## 2023-06-13 ENCOUNTER — Encounter
Admission: EM | Disposition: A | Discharge status: Skilled Nursing Facility | Payer: Self-pay | Source: Skilled Nursing Facility | Attending: Hospitalist

## 2023-06-13 ENCOUNTER — Inpatient Hospital Stay

## 2023-06-13 ENCOUNTER — Other Ambulatory Visit: Payer: Self-pay

## 2023-06-13 DIAGNOSIS — R001 Bradycardia, unspecified: Secondary | ICD-10-CM | POA: Diagnosis not present

## 2023-06-13 HISTORY — PX: PACEMAKER IMPLANT: EP1218

## 2023-06-13 LAB — CBC
HCT: 36 % (ref 36.0–46.0)
Hemoglobin: 12.4 g/dL (ref 12.0–15.0)
MCH: 30.5 pg (ref 26.0–34.0)
MCHC: 34.4 g/dL (ref 30.0–36.0)
MCV: 88.7 fL (ref 80.0–100.0)
Platelets: 289 10*3/uL (ref 150–400)
RBC: 4.06 MIL/uL (ref 3.87–5.11)
RDW: 13.5 % (ref 11.5–15.5)
WBC: 11 10*3/uL — ABNORMAL HIGH (ref 4.0–10.5)
nRBC: 0 % (ref 0.0–0.2)

## 2023-06-13 LAB — BASIC METABOLIC PANEL WITH GFR
Anion gap: 9 (ref 5–15)
BUN: 35 mg/dL — ABNORMAL HIGH (ref 8–23)
CO2: 24 mmol/L (ref 22–32)
Calcium: 9.6 mg/dL (ref 8.9–10.3)
Chloride: 98 mmol/L (ref 98–111)
Creatinine, Ser: 1.12 mg/dL — ABNORMAL HIGH (ref 0.44–1.00)
GFR, Estimated: 45 mL/min — ABNORMAL LOW (ref >60)
Glucose, Bld: 122 mg/dL — ABNORMAL HIGH (ref 70–99)
Potassium: 4 mmol/L (ref 3.5–5.1)
Sodium: 131 mmol/L — ABNORMAL LOW (ref 135–145)

## 2023-06-13 SURGERY — PACEMAKER IMPLANT
Anesthesia: Moderate Sedation

## 2023-06-13 MED ORDER — LIDOCAINE HCL 1 % IJ SOLN
INTRAMUSCULAR | Status: AC
  Filled 2023-06-13: qty 20

## 2023-06-13 MED ORDER — IOHEXOL 300 MG/ML  SOLN
INTRAMUSCULAR | Status: DC | PRN
  Administered 2023-06-13: 10 mL

## 2023-06-13 MED ORDER — SODIUM CHLORIDE 0.9 % IV SOLN
INTRAVENOUS | Status: DC
Start: 2023-06-13 — End: 2023-06-13

## 2023-06-13 MED ORDER — CHLORHEXIDINE GLUCONATE CLOTH 2 % EX PADS
6.0000 | MEDICATED_PAD | Freq: Every day | CUTANEOUS | Status: DC
  Administered 2023-06-13: 6 via TOPICAL

## 2023-06-13 MED ORDER — HEPARIN (PORCINE) IN NACL 1000-0.9 UT/500ML-% IV SOLN
INTRAVENOUS | Status: AC
  Filled 2023-06-13: qty 1000

## 2023-06-13 MED ORDER — FENTANYL CITRATE (PF) 100 MCG/2ML IJ SOLN
INTRAMUSCULAR | Status: DC | PRN
  Administered 2023-06-13: 25 ug via INTRAVENOUS

## 2023-06-13 MED ORDER — ACETAMINOPHEN 325 MG PO TABS
325.0000 mg | ORAL_TABLET | ORAL | Status: DC | PRN
  Administered 2023-06-13: 650 mg via ORAL
  Filled 2023-06-13: qty 2

## 2023-06-13 MED ORDER — SODIUM CHLORIDE 0.9 % IV SOLN
INTRAVENOUS | Status: DC | PRN
  Administered 2023-06-13: 80 mg

## 2023-06-13 MED ORDER — CEFAZOLIN SODIUM-DEXTROSE 1-4 GM/50ML-% IV SOLN
1.0000 g | Freq: Four times a day (QID) | INTRAVENOUS | Status: AC
  Administered 2023-06-13 (×2): 1 g via INTRAVENOUS
  Filled 2023-06-13 (×4): qty 50

## 2023-06-13 MED ORDER — CEPHALEXIN 500 MG PO CAPS
500.0000 mg | ORAL_CAPSULE | Freq: Two times a day (BID) | ORAL | Status: DC
  Administered 2023-06-14: 500 mg via ORAL
  Filled 2023-06-13: qty 1

## 2023-06-13 MED ORDER — CEFAZOLIN SODIUM-DEXTROSE 2-4 GM/100ML-% IV SOLN
2.0000 g | INTRAVENOUS | Status: DC
  Filled 2023-06-13: qty 100

## 2023-06-13 MED ORDER — CEFAZOLIN SODIUM-DEXTROSE 1-4 GM/50ML-% IV SOLN
INTRAVENOUS | Status: AC | PRN
  Administered 2023-06-13: 2 g via INTRAVENOUS

## 2023-06-13 MED ORDER — MIDAZOLAM HCL 2 MG/2ML IJ SOLN
INTRAMUSCULAR | Status: DC | PRN
  Administered 2023-06-13: .5 mg via INTRAVENOUS

## 2023-06-13 MED ORDER — MIDAZOLAM HCL 2 MG/2ML IJ SOLN
INTRAMUSCULAR | Status: AC
  Filled 2023-06-13: qty 2

## 2023-06-13 MED ORDER — FENTANYL CITRATE (PF) 100 MCG/2ML IJ SOLN
INTRAMUSCULAR | Status: AC
  Filled 2023-06-13: qty 2

## 2023-06-13 MED ORDER — LIDOCAINE HCL (PF) 1 % IJ SOLN
INTRAMUSCULAR | Status: DC | PRN
  Administered 2023-06-13: 10 mL

## 2023-06-13 MED ORDER — SODIUM CHLORIDE 0.9 % IV SOLN
80.0000 mg | INTRAVENOUS | Status: DC
  Filled 2023-06-13: qty 2

## 2023-06-13 SURGICAL SUPPLY — 14 items
CABLE SURG 12 DISP A/V CHANNEL (MISCELLANEOUS) IMPLANT
DEVICE DSSCT PLSMBLD 3.0S LGHT (MISCELLANEOUS) IMPLANT
DRAPE INCISE 23X17 STRL (DRAPES) IMPLANT
DRAPE INCISE IOBAN 23X17 STRL (DRAPES) ×1 IMPLANT
IPG PACE AZUR XT DR MRI W1DR01 (Pacemaker) IMPLANT
LEAD CAPSURE NOVUS 45CM (Lead) IMPLANT
LEAD CAPSURE NOVUS 5076-52CM (Lead) IMPLANT
PAD ELECT DEFIB RADIOL ZOLL (MISCELLANEOUS) IMPLANT
SHEATH 7FR PRELUDE SNAP 13 (SHEATH) IMPLANT
SHEATH 9FR PRELUDE SNAP 13 (SHEATH) IMPLANT
SUT SILK 0 FSL (SUTURE) IMPLANT
SUT VIC AB 2-0 CT2 27 (SUTURE) IMPLANT
SUT VIC AB 3-0 SH 27X BRD (SUTURE) IMPLANT
TRAY PACEMAKER INSERTION (PACKS) ×1 IMPLANT

## 2023-06-13 NOTE — Plan of Care (Signed)
  Problem: Education: Goal: Knowledge of General Education information will improve Description: Including pain rating scale, medication(s)/side effects and non-pharmacologic comfort measures Outcome: Progressing   Problem: Health Behavior/Discharge Planning: Goal: Ability to manage health-related needs will improve Outcome: Progressing   Problem: Clinical Measurements: Goal: Ability to maintain clinical measurements within normal limits will improve Outcome: Progressing Goal: Diagnostic test results will improve Outcome: Progressing Goal: Respiratory complications will improve Outcome: Progressing   Problem: Nutrition: Goal: Adequate nutrition will be maintained Outcome: Progressing   Problem: Coping: Goal: Level of anxiety will decrease Outcome: Progressing   Problem: Elimination: Goal: Will not experience complications related to bowel motility Outcome: Progressing Goal: Will not experience complications related to urinary retention Outcome: Progressing   Problem: Clinical Measurements: Goal: Will remain free from infection Outcome: Not Progressing Goal: Cardiovascular complication will be avoided Outcome: Not Progressing   Problem: Activity: Goal: Risk for activity intolerance will decrease Outcome: Not Progressing

## 2023-06-13 NOTE — Plan of Care (Signed)
  Problem: Education: Goal: Knowledge of General Education information will improve Description: Including pain rating scale, medication(s)/side effects and non-pharmacologic comfort measures Outcome: Progressing   Problem: Health Behavior/Discharge Planning: Goal: Ability to manage health-related needs will improve Outcome: Progressing   Problem: Clinical Measurements: Goal: Ability to maintain clinical measurements within normal limits will improve Outcome: Progressing Goal: Will remain free from infection Outcome: Progressing Goal: Diagnostic test results will improve Outcome: Progressing   Problem: Activity: Goal: Risk for activity intolerance will decrease Outcome: Progressing   Problem: Nutrition: Goal: Adequate nutrition will be maintained Outcome: Progressing   Problem: Elimination: Goal: Will not experience complications related to bowel motility Outcome: Progressing Goal: Will not experience complications related to urinary retention Outcome: Progressing   Problem: Pain Managment: Goal: General experience of comfort will improve and/or be controlled Outcome: Progressing   Problem: Safety: Goal: Ability to remain free from injury will improve Outcome: Progressing   Problem: Skin Integrity: Goal: Risk for impaired skin integrity will decrease Outcome: Progressing   Problem: Cardiac: Goal: Ability to achieve and maintain adequate cardiopulmonary perfusion will improve Outcome: Progressing   Problem: Clinical Measurements: Goal: Respiratory complications will improve Outcome: Not Progressing   Problem: Coping: Goal: Level of anxiety will decrease Outcome: Not Progressing

## 2023-06-13 NOTE — Discharge Instructions (Signed)
 For 6 weeks, avoid lifting greater than 15 pounds or raising your left arm above your head. Please do not shower until tomorrow and do not submerge your left chest in water (no baths, swimming) for at least 1 week or until you follow up with Dr. Custovic. You can remove the clear bandage on your left chest if it starts to peel off, but please do NOT take off the steri strips underneath (thin rectangular strips). Take all your medicines as prescribed, including the antibiotic I have prescribed called  Keflex (cefalexin). Please call Dr. Custovic 's office at [facility] [phone]) or if you have any questions or concerns.

## 2023-06-13 NOTE — Progress Notes (Signed)
 Patient arrived to room 253. VS stable and patient free from pain. Patient oriented to room and call bell in reach.

## 2023-06-13 NOTE — Progress Notes (Signed)
  PROGRESS NOTE    Victoria Marquez  ZOX:[id number] DOB: October 18, 1923 DOA: 06/12/2023 PCP: Victoria Spoon, MD  [id number]  LOS: 1 day   Brief hospital course:   Assessment & Plan: Victoria Marquez is a 89 y.o. female with medical history significant of HTN, HLD, GERD, anxiety/depression, presented with chest pain and dizziness.  heart rate in the 30s on presentation.   Symptomatic bradycardia Near syncope -concerning about sick sinus syndrome --pacemaker placement today   Hyponatremia --improved --discontinued home chlorthalidone --monitor   AKI CKD stage IIIb --Cr 1.51 on presentation, improved to 1.12 the next day --oral hydration   DVT prophylaxis: Heparin SQ Code Status: DNR  Family Communication: daughter updated at bedside today Level of care: Progressive Dispo:   The patient is from: home Anticipated d/c is to: home Anticipated d/c date is: tomorrow   Subjective and Interval History:  Received pacemaker today.  Tolerated it well.   Objective: Vitals:   06/13/23 1300 06/13/23 1400 06/13/23 1500 06/13/23 1600  BP: (!) 117/55 (!) 91/57 (!) 106/49   Pulse: (!) 59 (!) 59 68   Resp: 16 18 17    Temp:    98.3 F (36.8 C)  TempSrc:    Oral  SpO2: 98% 95% 97%   Weight:        Intake/Output Summary (Last 24 hours) at 06/13/2023 1803 Last data filed at 06/13/2023 1513 Gross per 24 hour  Intake 336.07 ml  Output 2325 ml  Net -1988.93 ml   Filed Weights   06/12/23 0650  Weight: 53.1 kg    Examination:   Constitutional: NAD, AAOx3 HEENT: conjunctivae and lids normal, EOMI CV: No cyanosis.   RESP: normal respiratory effort, on RA Neuro: II - XII grossly intact.   Psych: Normal mood and affect.  Appropriate judgement and reason   Data Reviewed: I have personally reviewed labs and imaging studies  Time spent: 35 minutes  Victoria Kanner, MD [facility] If 7PM-7AM, please contact night-coverage 06/13/2023, 6:03 PM

## 2023-06-13 NOTE — TOC Initial Note (Signed)
 Transition of Care [facility]) - Initial/Assessment Note    Patient Details  Name: Victoria Marquez MRN: [id number] Date of Birth: February 27, 1923  [facility] CM/SW Contact:    Elsie Halo, RN Phone Number: 06/13/2023, 4:26 PM  Clinical Narrative:                 [facility] visited patient in the room and the patient is sleep. TOC spoke with Cain Castillo at [facility] care and they feel that the member will benefit from SNF at [facility] when she returns. Therapy recs are pending at this time.   TOC outreached to the patient's daughter Gurney Lefort [phone]  and didn't receive and answer.  TOC will continue to montior.        Patient Goals and CMS Choice            Expected Discharge Plan and Services                                              Prior Living Arrangements/Services                       Activities of Daily Living   ADL Screening (condition at time of admission) Independently performs ADLs?: Yes (appropriate for developmental age) Is the patient deaf or have difficulty hearing?: Yes Does the patient have difficulty seeing, even when wearing glasses/contacts?: No Does the patient have difficulty concentrating, remembering, or making decisions?: Yes  Permission Sought/Granted                  Emotional Assessment              Admission diagnosis:  Bradycardia [R00.1] Hyponatremia [E87.1] Symptomatic bradycardia [R00.1] Nonspecific chest pain [R07.9] Patient Active Problem List   Diagnosis Date Noted   Symptomatic bradycardia 06/12/2023   Bradycardia 06/12/2023   Abnormal ankle brachial index 07/01/2019   Bilateral hand numbness 12/21/2016   History of gynecologic surgery 12/06/2016   Mixed incontinence 12/06/2016   Urinary incontinence with continuous leakage 08/28/2016   Blood glucose elevated 07/22/2015   HLD (hyperlipidemia) 07/22/2015   BP (high blood pressure) 07/22/2015   Osteopenia 07/22/2015    Avitaminosis D 07/22/2015   PCP:  Melchor Spoon, MD Pharmacy:   [facility] [phone] [facility] - [address] [place] [address] Phone: [phone] Fax: [phone]     Social Drivers of Health (SDOH) Social History: SDOH Screenings   Food Insecurity: No Food Insecurity (06/12/2023)  Housing: Low Risk  (06/12/2023)  Transportation Needs: No Transportation Needs (06/12/2023)  Utilities: Not At Risk (06/12/2023)  Social Connections: Moderately Integrated (06/12/2023)  Tobacco Use: Medium Risk (06/12/2023)   SDOH Interventions:     Readmission Risk Interventions     No data to display

## 2023-06-13 NOTE — Progress Notes (Signed)
 Patient transferred to 63 with CN Sarah and RN Donelda Fujita. Joel Murphy received patient at bedside. Vitals WDL at time this RN returned to unit. All patient belongings taken with patient and remained at bedside including phone and hearing aid case. Hearing aids bilaterally in patient ears at time of transfer.

## 2023-06-13 NOTE — Progress Notes (Signed)
 Family at bedside updated about patient transfer to room 253. Report given to 2A RN Sam. Waiting for bed to arrive to transfer patient. Belongings packed at bedside.

## 2023-06-13 NOTE — Progress Notes (Addendum)
 [facility] CLINIC CARDIOLOGY PROGRESS NOTE       Patient ID: Victoria Marquez MRN: [id number] DOB/AGE: September 28, 1923 89 y.o.  Admit date: 06/12/2023 Referring Physician Dr. Antoniette Batty Primary Physician Melchor Spoon, MD Primary Cardiologist Dr. Braxton Calico Reason for Consultation symptomatic bradycardia  HPI: Victoria Marquez is a 89 y.o. female  with a past medical history of hypertension and hyperlipemia who presented to the ED on 06/12/2023 for generalized weakness, fatigue. Patient found to have symptomatic bradycardia. EKG in ED revealed junctional escape rhythm. Patient does not take AVN blockers at home. Cardiology was consulted for further evaluation.   Interval History: -Patient seen and examined this AM and laying comfortably in hospital bed with family at bedside. Patient states she's feeling well s/p PPM and denies any cardiac sxs.  -Patients BP borderline and HR stable s/p PPM (06/04). EKG post-op with atrial pacing in 60s. Per tele pacemaker is capturing adequately. Intermittent atrial-pacing with rate 60s.  -Patient remains on room air with stable SpO2.  -Patient underwent dual chamber PPM with Dr. Parks Bollman (06/04) and patient tolerated procedure well.  -Left side of chest Incision site is clean and dry with no evidence of significant swelling, bruising, or active bleeding. -Post-Op CXR ordered.     Review of systems complete and found to be negative unless listed above    Past Medical History:  Diagnosis Date   Actinic keratosis    Anxiety    Cancer (HCC)    Colon polyps    Depression    Hyperglycemia    Hyperlipemia    Osteopenia    Urinary incontinence    Vitamin D deficiency     Past Surgical History:  Procedure Laterality Date   BLADDER SURGERY     EYE SURGERY      Medications Prior to Admission  Medication Sig Dispense Refill Last Dose/Taking   alendronate (FOSAMAX) 70 MG tablet    06/11/2023 Morning   amLODipine (NORVASC) 5 MG tablet Take 5 mg by mouth 2  (two) times daily.  4 06/11/2023   azelastine (ASTELIN) 0.1 % nasal spray Place into the nose.   Taking   Calcium Carbonate-Vitamin D 600-200 MG-UNIT TABS Take by mouth.   Taking   cetirizine  (ZYRTEC ) 5 MG tablet Take 1 tablet (5 mg total) by mouth at bedtime for 5 days. 5 tablet 0 06/11/2023   chlorthalidone (HYGROTON) 25 MG tablet Take 12.5 mg by mouth daily.   06/11/2023   Cholecalciferol 25 MCG (1000 UT) tablet Take 2,000 Units by mouth daily.   Taking   estradiol (ESTRACE) 0.1 MG/GM vaginal cream Place 1 Applicatorful vaginally at bedtime.   Taking   fluticasone (FLONASE) 50 MCG/ACT nasal spray Place into the nose.   06/11/2023   Folic Acid 20 MG CAPS Take by mouth.   06/11/2023   losartan (COZAAR) 100 MG tablet Take 100 mg by mouth daily.   06/11/2023   Omega-3 Fatty Acids (FISH OIL) 1000 MG CAPS Take by mouth.   06/11/2023   ondansetron  (ZOFRAN -ODT) 4 MG disintegrating tablet Take 4 mg by mouth every 8 (eight) hours as needed.   Taking As Needed   telmisartan (MICARDIS) 80 MG tablet Take 80 mg by mouth.   Taking   Trospium Chloride 60 MG CP24 trospium ER 60 mg capsule,extended release 24 hr   06/11/2023   amoxicillin (AMOXIL) 875 MG tablet Take by mouth. (Patient not taking: Reported on 06/12/2023)   Not Taking   aspirin  EC 81 MG tablet Take  81 mg by mouth daily. (Patient not taking: Reported on 06/12/2023)   Not Taking   Influenza vac split quadrivalent PF (FLUZONE HIGH-DOSE) 0.5 ML injection Fluzone High-Dose 2018-2019 (PF) 180 mcg/0.5 mL intramuscular syringe  TO BE ADMINISTERED BY PHARMACIST FOR IMMUNIZATION      pantoprazole (PROTONIX) 40 MG tablet  (Patient not taking: Reported on 06/12/2023)   Not Taking   valACYclovir  (VALTREX ) 1000 MG tablet Take 1 tablet (1,000 mg total) by mouth 3 (three) times daily. (Patient not taking: Reported on 06/12/2023) 21 tablet 0 Not Taking   Zoster Vaccine Adjuvanted [facility]) injection Shingrix (PF) 50 mcg/0.5 mL intramuscular suspension, kit      Social History    Socioeconomic History   Marital status: Widowed    Spouse name: Not on file   Number of children: Not on file   Years of education: Not on file   Highest education level: Not on file  Occupational History   Not on file  Tobacco Use   Smoking status: Former    Current packs/day: 0.00    Types: Cigarettes    Quit date: 07/22/1990    Years since quitting: 32.9   Smokeless tobacco: Never  Vaping Use   Vaping status: Never Used  Substance and Sexual Activity   Alcohol use: Yes    Alcohol/week: 0.0 standard drinks of alcohol   Drug use: No   Sexual activity: Not Currently  Other Topics Concern   Not on file  Social History Narrative   Not on file   Social Drivers of Health   Financial Resource Strain: Not on file  Food Insecurity: No Food Insecurity (06/12/2023)   Hunger Vital Sign    Worried About Running Out of Food in the Last Year: Never true    Ran Out of Food in the Last Year: Never true  Transportation Needs: No Transportation Needs (06/12/2023)   PRAPARE - Administrator, Civil Service (Medical): No    Lack of Transportation (Non-Medical): No  Physical Activity: Not on file  Stress: Not on file  Social Connections: Moderately Integrated (06/12/2023)   Social Connection and Isolation Panel [NHANES]    Frequency of Communication with Friends and Family: Three times a week    Frequency of Social Gatherings with Friends and Family: Three times a week    Attends Religious Services: 1 to 4 times per year    Active Member of Clubs or Organizations: Yes    Attends Banker Meetings: More than 4 times per year    Marital Status: Widowed  Intimate Partner Violence: Not At Risk (06/12/2023)   Humiliation, Afraid, Rape, and Kick questionnaire    Fear of Current or Ex-Partner: No    Emotionally Abused: No    Physically Abused: No    Sexually Abused: No    History reviewed. No pertinent family history.   Vitals:   06/13/23 0909 06/13/23 0926 06/13/23  0930 06/13/23 0945  BP: (!) 101/49 (!) 99/53 (!) 97/49 (!) 105/48  Pulse: 63 60 60 60  Resp: 15 18 14 20   Temp:      TempSrc:      SpO2:  96% 97% 99%  Weight:        PHYSICAL EXAM General: well appearing elderly female, well nourished, in no acute distress. HEENT: Normocephalic and atraumatic. Neck: No JVD.   Lungs: Normal respiratory effort on 3L. Clear bilaterally to auscultation. No wheezes, crackles, rhonchi.  Heart: HRRR. Normal S1 and S2 without gallops or  murmurs. Left chest incision site clear, dry without significant swelling or tenderness. Abdomen: Non-distended appearing.  Msk: Normal strength and tone for age. Extremities: Warm and well perfused. No clubbing, cyanosis. No edema.  Neuro: Alert and oriented X 3. Psych: Answers questions appropriately.   Labs: Basic Metabolic Panel: Recent Labs    06/12/23 0356 06/13/23 0350  NA 124* 131*  K 4.2 4.0  CL 94* 98  CO2 19* 24  GLUCOSE 116* 122*  BUN 45* 35*  CREATININE 1.51* 1.12*  CALCIUM 8.6* 9.6  MG 2.1  --    Liver Function Tests: Recent Labs    06/12/23 0356  AST 44*  ALT 34  ALKPHOS 44  BILITOT 0.9  PROT 6.3*  ALBUMIN 3.6   No results for input(s): "LIPASE", "AMYLASE" in the last 72 hours. CBC: Recent Labs    06/12/23 0356 06/13/23 0350  WBC 10.7* 11.0*  HGB 12.0 12.4  HCT 35.1* 36.0  MCV 92.1 88.7  PLT 253 289   Cardiac Enzymes: Recent Labs    06/12/23 0356 06/12/23 0621  TROPONINIHS 16 17   BNP: Recent Labs    06/12/23 1129  BNP 479.0*   D-Dimer: Recent Labs    06/12/23 0444  DDIMER 0.53*   Hemoglobin A1C: No results for input(s): "HGBA1C" in the last 72 hours. Fasting Lipid Panel: No results for input(s): "CHOL", "HDL", "LDLCALC", "TRIG", "CHOLHDL", "LDLDIRECT" in the last 72 hours. Thyroid Function Tests: Recent Labs    06/12/23 0356  TSH 4.512*   Anemia Panel: No results for input(s): "VITAMINB12", "FOLATE", "FERRITIN", "TIBC", "IRON", "RETICCTPCT" in the last  72 hours.   Radiology: EP PPM/ICD IMPLANT Result Date: 06/13/2023 Successful dual-chamber pacemaker implantation (Medtronic Azure XT DR MRI)   ECHOCARDIOGRAM COMPLETE Result Date: 06/12/2023    ECHOCARDIOGRAM REPORT   Patient Name:   NORRIS BODLEY Date of Exam: 06/12/2023 Medical Rec #:  [id number]        Height:       63.0 in Accession #:    [id number]       Weight:       117.1 lb Date of Birth:  27-Jan-1923         BSA:          1.540 m Patient Age:    89 years         BP:           156/84 mmHg Patient Gender: F                HR:           76 bpm. Exam Location:  [facility] Procedure: 2D Echo, Cardiac Doppler and Color Doppler (Both Spectral and Color            Flow Doppler were utilized during procedure). Indications:     Other abnormalities of the heart R00.8  History:         Patient has no prior history of Echocardiogram examinations.                  Risk Factors:Dyslipidemia. Anxiety.  Sonographer:     Broadus Canes Referring Phys:  [id number] Jaimey Franchini Diagnosing Phys: Lida Reeks Alluri IMPRESSIONS  1. Left ventricular ejection fraction, by estimation, is 65 to 70%. The left ventricle has normal function. The left ventricle has no regional wall motion abnormalities. There is mild left ventricular hypertrophy. Left ventricular diastolic parameters are consistent with Grade I diastolic dysfunction (impaired relaxation).  2. Right ventricular systolic function  is normal. The right ventricular size is normal. There is moderately elevated pulmonary artery systolic pressure.  3. Left atrial size was moderately dilated.  4. The mitral valve is normal in structure. Trivial mitral valve regurgitation.  5. The aortic valve is tricuspid. Aortic valve regurgitation is not visualized.  6. The inferior vena cava is normal in size with <50% respiratory variability, suggesting right atrial pressure of 8 mmHg. FINDINGS  Left Ventricle: Left ventricular ejection fraction, by estimation, is 65 to 70%. The left ventricle has  normal function. The left ventricle has no regional wall motion abnormalities. The left ventricular internal cavity size was normal in size. There is  mild left ventricular hypertrophy. Left ventricular diastolic parameters are consistent with Grade I diastolic dysfunction (impaired relaxation). Right Ventricle: The right ventricular size is normal. No increase in right ventricular wall thickness. Right ventricular systolic function is normal. There is moderately elevated pulmonary artery systolic pressure. The tricuspid regurgitant velocity is 3.24 m/s, and with an assumed right atrial pressure of 8 mmHg, the estimated right ventricular systolic pressure is 50.0 mmHg. Left Atrium: Left atrial size was moderately dilated. Right Atrium: Right atrial size was normal in size. Pericardium: There is no evidence of pericardial effusion. Mitral Valve: The mitral valve is normal in structure. Trivial mitral valve regurgitation. MV peak gradient, 4.8 mmHg. The mean mitral valve gradient is 2.0 mmHg. Tricuspid Valve: The tricuspid valve is normal in structure. Tricuspid valve regurgitation is mild. Aortic Valve: The aortic valve is tricuspid. Aortic valve regurgitation is not visualized. Aortic valve mean gradient measures 6.0 mmHg. Aortic valve peak gradient measures 9.9 mmHg. Aortic valve area, by VTI measures 2.48 cm. Pulmonic Valve: The pulmonic valve was not well visualized. Pulmonic valve regurgitation is trivial. Aorta: The aortic root is normal in size and structure. Venous: The inferior vena cava is normal in size with less than 50% respiratory variability, suggesting right atrial pressure of 8 mmHg. IAS/Shunts: The atrial septum is grossly normal.  LEFT VENTRICLE PLAX 2D LVIDd:         3.60 cm   Diastology LVIDs:         2.60 cm   LV e' medial:    5.98 cm/s LV PW:         1.20 cm   LV E/e' medial:  16.0 LV IVS:        1.60 cm   LV e' lateral:   8.81 cm/s LVOT diam:     2.00 cm   LV E/e' lateral: 10.9 LV SV:          89 LV SV Index:   58 LVOT Area:     3.14 cm  RIGHT VENTRICLE RV Basal diam:  3.00 cm RV Mid diam:    2.90 cm RV S prime:     15.60 cm/s TAPSE (M-mode): 2.4 cm LEFT ATRIUM             Index        RIGHT ATRIUM           Index LA diam:        2.80 cm 1.82 cm/m   RA Area:     13.20 cm LA Vol (A2C):   90.7 ml 58.90 ml/m  RA Volume:   28.60 ml  18.57 ml/m LA Vol (A4C):   50.9 ml 33.05 ml/m LA Biplane Vol: 72.2 ml 46.88 ml/m  AORTIC VALVE AV Area (Vmax):    2.15 cm AV Area (Vmean):   2.21 cm AV  Area (VTI):     2.48 cm AV Vmax:           157.50 cm/s AV Vmean:          111.000 cm/s AV VTI:            0.359 m AV Peak Grad:      9.9 mmHg AV Mean Grad:      6.0 mmHg LVOT Vmax:         108.00 cm/s LVOT Vmean:        78.200 cm/s LVOT VTI:          0.283 m LVOT/AV VTI ratio: 0.79  AORTA Ao Root diam: 3.20 cm MITRAL VALVE               TRICUSPID VALVE MV Area (PHT): 2.85 cm    TR Peak grad:   42.0 mmHg MV Area VTI:   1.94 cm    TR Vmax:        324.00 cm/s MV Peak grad:  4.8 mmHg MV Mean grad:  2.0 mmHg    SHUNTS MV Vmax:       1.09 m/s    Systemic VTI:  0.28 m MV Vmean:      67.2 cm/s   Systemic Diam: 2.00 cm MV Decel Time: 266 msec MV E velocity: 95.90 cm/s MV A velocity: 83.30 cm/s MV E/A ratio:  1.15 Joetta Mustache Electronically signed by Joetta Mustache Signature Date/Time: 06/12/2023/4:39:13 PM    Final    DG Chest Port 1 View Result Date: 06/12/2023 CLINICAL DATA:  Chest pain EXAM: PORTABLE CHEST 1 VIEW COMPARISON:  None Available. FINDINGS: The lungs are symmetrically hyperinflated in keeping with changes of underlying COPD. The lungs are clear. No pneumothorax or pleural effusion. Cardiac size is within normal limits. Pulmonary vascularity is normal. Osseous structures are age appropriate. Healed right proximal humeral fracture. IMPRESSION: 1. No active disease. COPD. Electronically Signed   By: Worthy Heads M.D.   On: 06/12/2023 04:19    ECHO as above  TELEMETRY reviewed by me 06/13/2023: intermittent  atrial pacing in 60s   EKG reviewed by me: Junctional escape rhythm, rate 36 bpm  Data reviewed by me 06/13/2023: last 24h vitals tele labs imaging I/O hospitalist progress note  Principal Problem:   Symptomatic bradycardia Active Problems:   Bradycardia    ASSESSMENT AND PLAN:   Victoria Marquez is a 89 y.o. female  with a past medical history of hypertension and hyperlipemia who presented to the ED on 06/12/2023 for generalized weakness, fatigue. Patient found to have symptomatic bradycardia. EKG in ED revealed junctional escape rhythm. Patient does not take AVN blockers at home and electrolytes stable. Cardiology was consulted for further evaluation.   # Sinus node dysfunction s/p PPM (Medtronic Azure XT DR MRI) - 06/13/23 # Hypertension # Hyperlipidemia Patient presents with symptomatic bradycardia and found to have junctional escape rhythm in 30s. Patient lives alone and performs all ADLs, and walks with walker with no difficuly. Patient received 1x dose 0.5 mg atropine that helped bring up HR to 50s. Patient started on low dose dopamine, HR remains stable on dopamine. Pads in place in ED. BNP elevated at 470. Trops negative x2. Echo this admission with pEF (65-70%) with no RWMA, grade 1 diastolic dysfunction, mod elevated PASP, mod dilated LA. Underwent PPM by Dr. Parks Bollman (06/04) and patient tolerated procedure well. EKG post-op with atrial pacing in 60s. Per tele pacemaker is capturing adequately. Intermittent atrial-pacing with rate 60s. -Post-op  CXR ordered. -Monitor and replenish electrolytes for a goal K >4, Mag >2  -Start ABX prophylaxis with Keflex 500 mg BID tomorrow for 10 days. -Consider resuming home antihypertensive medication when BP improves/stabilizes.  -Plan for discharge tomorrow if patient remains stable. -Patient's son states patient lives at [facility] and will have 3 days of strict monitoring after procedure.  -Discussed PPM care instruction with patient and family.      This patient's plan of care was discussed and created with Dr. Bob Burn and he is in agreement.  Signed: Creighton Doffing, PA-C  06/13/2023, 10:36 AM [facility] Cardiology

## 2023-06-14 ENCOUNTER — Other Ambulatory Visit: Payer: Self-pay

## 2023-06-14 DIAGNOSIS — R001 Bradycardia, unspecified: Secondary | ICD-10-CM | POA: Diagnosis not present

## 2023-06-14 LAB — CBC
HCT: 33.3 % — ABNORMAL LOW (ref 36.0–46.0)
Hemoglobin: 11.3 g/dL — ABNORMAL LOW (ref 12.0–15.0)
MCH: 30.8 pg (ref 26.0–34.0)
MCHC: 33.9 g/dL (ref 30.0–36.0)
MCV: 90.7 fL (ref 80.0–100.0)
Platelets: 229 10*3/uL (ref 150–400)
RBC: 3.67 MIL/uL — ABNORMAL LOW (ref 3.87–5.11)
RDW: 13.9 % (ref 11.5–15.5)
WBC: 9.7 10*3/uL (ref 4.0–10.5)
nRBC: 0 % (ref 0.0–0.2)

## 2023-06-14 LAB — BASIC METABOLIC PANEL WITH GFR
Anion gap: 7 (ref 5–15)
BUN: 34 mg/dL — ABNORMAL HIGH (ref 8–23)
CO2: 24 mmol/L (ref 22–32)
Calcium: 8.7 mg/dL — ABNORMAL LOW (ref 8.9–10.3)
Chloride: 100 mmol/L (ref 98–111)
Creatinine, Ser: 0.98 mg/dL (ref 0.44–1.00)
GFR, Estimated: 52 mL/min — ABNORMAL LOW (ref >60)
Glucose, Bld: 98 mg/dL (ref 70–99)
Potassium: 3.5 mmol/L (ref 3.5–5.1)
Sodium: 131 mmol/L — ABNORMAL LOW (ref 135–145)

## 2023-06-14 LAB — MAGNESIUM: Magnesium: 2 mg/dL (ref 1.7–2.4)

## 2023-06-14 MED ORDER — CEFAZOLIN SODIUM-DEXTROSE 1-4 GM/50ML-% IV SOLN
1.0000 g | Freq: Once | INTRAVENOUS | Status: AC
  Administered 2023-06-14: 1 g via INTRAVENOUS
  Filled 2023-06-14: qty 50

## 2023-06-14 MED ORDER — CEPHALEXIN 500 MG PO CAPS
500.0000 mg | ORAL_CAPSULE | Freq: Two times a day (BID) | ORAL | 0 refills | Status: AC
  Filled 2023-06-14: qty 20, 10d supply, fill #0

## 2023-06-14 NOTE — Plan of Care (Signed)

## 2023-06-14 NOTE — Progress Notes (Addendum)
 Report called to [place] at [facility].

## 2023-06-14 NOTE — NC FL2 (Signed)
 [place]  MEDICAID [id number] LEVEL OF CARE FORM     IDENTIFICATION  Patient Name: Victoria Marquez Birthdate: Nov 12, 1923 Sex: female Admission Date (Current Location): 06/12/2023  [place] and [place] Number:  Chiropodist and Address:  [facility], [address], [place], [place] [address]      Provider Number: [id number]  Attending Physician Name and Address:  Garrison Kanner, MD  Relative Name and Phone Number:  Shantrell, Placzek (Daughter)  [phone] [facility])    Current Level of Care: Hospital Recommended Level of Care: Other (Comment) (MCALF) Prior Approval Number:    Date Approved/Denied:   PASRR Number: [id number]  Discharge Plan: Other (Comment) (MCALF)    Current Diagnoses: Patient Active Problem List   Diagnosis Date Noted   Symptomatic bradycardia 06/12/2023   Bradycardia 06/12/2023   Abnormal ankle brachial index 07/01/2019   Bilateral hand numbness 12/21/2016   History of gynecologic surgery 12/06/2016   Mixed incontinence 12/06/2016   Urinary incontinence with continuous leakage 08/28/2016   Blood glucose elevated 07/22/2015   HLD (hyperlipidemia) 07/22/2015   BP (high blood pressure) 07/22/2015   Osteopenia 07/22/2015   Avitaminosis D 07/22/2015    Orientation RESPIRATION BLADDER Height & Weight     Self, Time, Situation, Place  Normal External catheter, Incontinent Weight: 53.1 kg Height:     BEHAVIORAL SYMPTOMS/MOOD NEUROLOGICAL BOWEL NUTRITION STATUS  Other (Comment) (n/a)  (n/a) Continent Diet (Heart)  AMBULATORY STATUS COMMUNICATION OF NEEDS Skin   Limited Assist Verbally Bruising (eechymosis bilateral arm)                       Personal Care Assistance Level of Assistance  Bathing, Feeding, Dressing Bathing Assistance: Limited assistance Feeding assistance: Limited assistance Dressing Assistance: Limited assistance     Functional Limitations Info  Sight, Hearing Sight Info: Adequate Hearing Info:  Adequate      SPECIAL CARE FACTORS FREQUENCY                       Contractures Contractures Info: Not present    Additional Factors Info  Code Status, Allergies Code Status Info: DNR Allergies Info: Codeine           Current Medications (06/14/2023):  This is the current hospital active medication list Current Facility-Administered Medications  Medication Dose Route Frequency Provider Last Rate Last Admin   acetaminophen  (TYLENOL ) tablet 325-650 mg  325-650 mg Oral Q4H PRN Paraschos, Alexander, MD   650 mg at 06/13/23 2045   azelastine (ASTELIN) 0.1 % nasal spray 1 spray  1 spray Each Nare BID PRN Antoniette Batty T, MD       cephALEXin [facility]) capsule 500 mg  500 mg Oral BID Decoste, Gabriella, PA-C   500 mg at 06/14/23 0911   Chlorhexidine Gluconate Cloth 2 % PADS 6 each  6 each Topical QHS [place], MD   6 each at 06/13/23 2046   fluticasone (FLONASE) 50 MCG/ACT nasal spray 1 spray  1 spray Each Nare Daily Antoniette Batty T, MD   1 spray at 06/12/23 1144   heparin injection 5,000 Units  5,000 Units Subcutaneous Q12H Antoniette Batty T, MD   5,000 Units at 06/14/23 0911   loratadine (CLARITIN) tablet 10 mg  10 mg Oral Daily Antoniette Batty T, MD   10 mg at 06/14/23 0911   ondansetron  (ZOFRAN ) injection 4 mg  4 mg Intravenous Q6H PRN [place], MD   4 mg at 06/12/23  3244   Oral care mouth rinse  15 mL Mouth Rinse PRN [place], MD       senna-docusate (Senokot-S) tablet 1 tablet  1 tablet Oral QHS PRN [place], MD         Discharge Medications: Please see discharge summary for a list of discharge medications.  Relevant Imaging Results:  Relevant Lab Results:   Additional Information SS# [id number]  Flavia Bruss C Keaton Stirewalt, RN

## 2023-06-14 NOTE — Discharge Summary (Signed)
 Physician Discharge Summary   [name]  female DOB: [birth year]  UJW:[id number]  PCP: Melchor Spoon, MD  Admit date: 06/12/2023 Discharge date: 06/14/2023  Admitted From: independent living at [facility]  Disposition:  [facility] (for respite).   CODE STATUS: DNR   Hospital Course:  For full details, please see H&P, progress notes, consult notes and ancillary notes.  Briefly,  Victoria Marquez is a 89 y.o. female with medical history significant of HTN, presented with chest pain and dizziness.  Heart rate in the 30s on presentation.   Symptomatic bradycardia Sinus node dysfunction s/p PPM (Medtronic Azure XT DR MRI) - 06/13/23  --concerning about sick sinus syndrome --pacemaker placed on 06/13/23. --discharged on Keflex for 10 days for ppx --cardio to arrange for follow up in clinic with Dr. Braxton Calico in 1-2 weeks.   Hyponatremia --Na 124 on presentation, improved to 131 prior to discharge. --discontinued home chlorthalidone   AKI CKD stage IIIb --Cr 1.51 on presentation, improved to 0.98 prior to discharge.  HTN --BP intermittently low normal without home BP meds.  Discontinued home chlorthalidone and losartan.  Hold home amlodipine and telmisartan pending outpatient f/u.   Unless noted above, medications under "STOP" list are ones pt was not taking PTA.  Discharge Diagnoses:  Principal Problem:   Symptomatic bradycardia Active Problems:   Bradycardia   30 Day Unplanned Readmission Risk Score    Flowsheet Row ED to Hosp-Admission (Current) from 06/12/2023 in [facility] PCU  30 Day Unplanned Readmission Risk Score (%) 17.68 Filed at 06/14/2023 0801       This score is the patient's risk of an unplanned readmission within 30 days of being discharged (0 -100%). The score is based on dignosis, age, lab data, medications, orders, and past utilization.   Low:  0-14.9   Medium: 15-21.9   High: 22-29.9   Extreme: 30 and above          Discharge Instructions:  Allergies as of 06/14/2023       Reactions   Codeine Other (See Comments)   Does not remember reaction         Medication List     PAUSE taking these medications    amLODipine 5 MG tablet Wait to take this until your doctor or other care provider tells you to start again. Due to normal blood pressure without blood pressure medication. Commonly known as: NORVASC Take 5 mg by mouth 2 (two) times daily.   telmisartan 80 MG tablet Wait to take this until your doctor or other care provider tells you to start again. Due to normal blood pressure without blood pressure medication. Commonly known as: MICARDIS Take 80 mg by mouth.       STOP taking these medications    amoxicillin 875 MG tablet Commonly known as: AMOXIL   aspirin  EC 81 MG tablet   cetirizine  5 MG tablet Commonly known as: ZYRTEC    chlorthalidone 25 MG tablet Commonly known as: HYGROTON   Influenza vac split quadrivalent PF 0.5 ML injection Commonly known as: FLUZONE HIGH-DOSE   losartan 100 MG tablet Commonly known as: COZAAR   pantoprazole 40 MG tablet Commonly known as: PROTONIX   Shingrix injection Generic drug: Zoster Vaccine Adjuvanted   valACYclovir  1000 MG tablet Commonly known as: VALTREX        TAKE these medications    alendronate 70 MG tablet Commonly known as: FOSAMAX   azelastine 0.1 % nasal spray Commonly known as:  ASTELIN Place into the nose.   Calcium Carbonate-Vitamin D 600-200 MG-UNIT Tabs Take by mouth.   cephALEXin 500 MG capsule Commonly known as: KEFLEX Take 1 capsule (500 mg total) by mouth 2 (two) times daily for 10 days.   Cholecalciferol 25 MCG (1000 UT) tablet Take 2,000 Units by mouth daily.   estradiol 0.1 MG/GM vaginal cream Commonly known as: ESTRACE Place 1 Applicatorful vaginally at bedtime.   Fish Oil 1000 MG Caps Take by mouth.   fluticasone 50 MCG/ACT nasal spray Commonly known as: FLONASE Place into the  nose.   Folic Acid 20 MG Caps Take by mouth.   ondansetron  4 MG disintegrating tablet Commonly known as: ZOFRAN -ODT Take 4 mg by mouth every 8 (eight) hours as needed.   Trospium Chloride 60 MG Cp24 trospium ER 60 mg capsule,extended release 24 hr         Follow-up Information     Custovic, Sabina, DO. Go in 1 week(s).   Specialty: Cardiology Contact information: [address] [place] [address] [phone]                 Allergies  Allergen Reactions   Codeine Other (See Comments)    Does not remember reaction      The results of significant diagnostics from this hospitalization (including imaging, microbiology, ancillary and laboratory) are listed below for reference.   Consultations:   Procedures/Studies: DG Chest Port 1 View Result Date: 06/13/2023 CLINICAL DATA:  Pacemaker placement. EXAM: PORTABLE CHEST 1 VIEW COMPARISON:  June 12, 2023. FINDINGS: Stable cardiomediastinal silhouette. Left-sided pacemaker is noted with leads in grossly good position. No pneumothorax. Both lungs are clear. The visualized skeletal structures are unremarkable. IMPRESSION: Interval placement of left-sided pacemaker. Electronically Signed   By: Rosalene Colon M.D.   On: 06/13/2023 14:13   EP PPM/ICD IMPLANT Result Date: 06/13/2023 Successful dual-chamber pacemaker implantation (Medtronic Azure XT DR MRI)   ECHOCARDIOGRAM COMPLETE Result Date: 06/12/2023    ECHOCARDIOGRAM REPORT   Patient Name:   Victoria Marquez Date of Exam: 06/12/2023 Medical Rec #:  [id number]        Height:       63.0 in Accession #:    [id number]       Weight:       117.1 lb Date of Birth:  [birth year]         BSA:          1.540 m Patient Age:    89 years         BP:           156/84 mmHg Patient Gender: F                HR:           76 bpm. Exam Location:  [facility] Procedure: 2D Echo, Cardiac Doppler and Color Doppler (Both Spectral and Color            Flow Doppler were utilized during procedure).  Indications:     Other abnormalities of the heart R00.8  History:         Patient has no prior history of Echocardiogram examinations.                  Risk Factors:Dyslipidemia. Anxiety.  Sonographer:     Broadus Canes Referring Phys:  [id number] GABRIELLA DECOSTE Diagnosing Phys: Lida Reeks Alluri IMPRESSIONS  1. Left ventricular ejection fraction, by estimation, is 65 to 70%. The left ventricle has normal  function. The left ventricle has no regional wall motion abnormalities. There is mild left ventricular hypertrophy. Left ventricular diastolic parameters are consistent with Grade I diastolic dysfunction (impaired relaxation).  2. Right ventricular systolic function is normal. The right ventricular size is normal. There is moderately elevated pulmonary artery systolic pressure.  3. Left atrial size was moderately dilated.  4. The mitral valve is normal in structure. Trivial mitral valve regurgitation.  5. The aortic valve is tricuspid. Aortic valve regurgitation is not visualized.  6. The inferior vena cava is normal in size with <50% respiratory variability, suggesting right atrial pressure of 8 mmHg. FINDINGS  Left Ventricle: Left ventricular ejection fraction, by estimation, is 65 to 70%. The left ventricle has normal function. The left ventricle has no regional wall motion abnormalities. The left ventricular internal cavity size was normal in size. There is  mild left ventricular hypertrophy. Left ventricular diastolic parameters are consistent with Grade I diastolic dysfunction (impaired relaxation). Right Ventricle: The right ventricular size is normal. No increase in right ventricular wall thickness. Right ventricular systolic function is normal. There is moderately elevated pulmonary artery systolic pressure. The tricuspid regurgitant velocity is 3.24 m/s, and with an assumed right atrial pressure of 8 mmHg, the estimated right ventricular systolic pressure is 50.0 mmHg. Left Atrium: Left atrial size was  moderately dilated. Right Atrium: Right atrial size was normal in size. Pericardium: There is no evidence of pericardial effusion. Mitral Valve: The mitral valve is normal in structure. Trivial mitral valve regurgitation. MV peak gradient, 4.8 mmHg. The mean mitral valve gradient is 2.0 mmHg. Tricuspid Valve: The tricuspid valve is normal in structure. Tricuspid valve regurgitation is mild. Aortic Valve: The aortic valve is tricuspid. Aortic valve regurgitation is not visualized. Aortic valve mean gradient measures 6.0 mmHg. Aortic valve peak gradient measures 9.9 mmHg. Aortic valve area, by VTI measures 2.48 cm. Pulmonic Valve: The pulmonic valve was not well visualized. Pulmonic valve regurgitation is trivial. Aorta: The aortic root is normal in size and structure. Venous: The inferior vena cava is normal in size with less than 50% respiratory variability, suggesting right atrial pressure of 8 mmHg. IAS/Shunts: The atrial septum is grossly normal.  LEFT VENTRICLE PLAX 2D LVIDd:         3.60 cm   Diastology LVIDs:         2.60 cm   LV e' medial:    5.98 cm/s LV PW:         1.20 cm   LV E/e' medial:  16.0 LV IVS:        1.60 cm   LV e' lateral:   8.81 cm/s LVOT diam:     2.00 cm   LV E/e' lateral: 10.9 LV SV:         89 LV SV Index:   58 LVOT Area:     3.14 cm  RIGHT VENTRICLE RV Basal diam:  3.00 cm RV Mid diam:    2.90 cm RV S prime:     15.60 cm/s TAPSE (M-mode): 2.4 cm LEFT ATRIUM             Index        RIGHT ATRIUM           Index LA diam:        2.80 cm 1.82 cm/m   RA Area:     13.20 cm LA Vol (A2C):   90.7 ml 58.90 ml/m  RA Volume:   28.60 ml  18.57 ml/m LA  Vol (A4C):   50.9 ml 33.05 ml/m LA Biplane Vol: 72.2 ml 46.88 ml/m  AORTIC VALVE AV Area (Vmax):    2.15 cm AV Area (Vmean):   2.21 cm AV Area (VTI):     2.48 cm AV Vmax:           157.50 cm/s AV Vmean:          111.000 cm/s AV VTI:            0.359 m AV Peak Grad:      9.9 mmHg AV Mean Grad:      6.0 mmHg LVOT Vmax:         108.00 cm/s LVOT  Vmean:        78.200 cm/s LVOT VTI:          0.283 m LVOT/AV VTI ratio: 0.79  AORTA Ao Root diam: 3.20 cm MITRAL VALVE               TRICUSPID VALVE MV Area (PHT): 2.85 cm    TR Peak grad:   42.0 mmHg MV Area VTI:   1.94 cm    TR Vmax:        324.00 cm/s MV Peak grad:  4.8 mmHg MV Mean grad:  2.0 mmHg    SHUNTS MV Vmax:       1.09 m/s    Systemic VTI:  0.28 m MV Vmean:      67.2 cm/s   Systemic Diam: 2.00 cm MV Decel Time: 266 msec MV E velocity: 95.90 cm/s MV A velocity: 83.30 cm/s MV E/A ratio:  1.15 Joetta Mustache Electronically signed by Joetta Mustache Signature Date/Time: 06/12/2023/4:39:13 PM    Final    DG Chest Port 1 View Result Date: 06/12/2023 CLINICAL DATA:  Chest pain EXAM: PORTABLE CHEST 1 VIEW COMPARISON:  None Available. FINDINGS: The lungs are symmetrically hyperinflated in keeping with changes of underlying COPD. The lungs are clear. No pneumothorax or pleural effusion. Cardiac size is within normal limits. Pulmonary vascularity is normal. Osseous structures are age appropriate. Healed right proximal humeral fracture. IMPRESSION: 1. No active disease. COPD. Electronically Signed   By: Worthy Heads M.D.   On: 06/12/2023 04:19      Labs: BNP (last 3 results) Recent Labs    04/15/23 0750 06/12/23 1129  BNP 504.4* 479.0*   Basic Metabolic Panel: Recent Labs  Lab 06/12/23 0356 06/13/23 0350 06/14/23 0422  NA 124* 131* 131*  K 4.2 4.0 3.5  CL 94* 98 100  CO2 19* 24 24  GLUCOSE 116* 122* 98  BUN 45* 35* 34*  CREATININE 1.51* 1.12* 0.98  CALCIUM 8.6* 9.6 8.7*  MG 2.1  --  2.0   Liver Function Tests: Recent Labs  Lab 06/12/23 0356  AST 44*  ALT 34  ALKPHOS 44  BILITOT 0.9  PROT 6.3*  ALBUMIN 3.6   No results for input(s): "LIPASE", "AMYLASE" in the last 168 hours. No results for input(s): "AMMONIA" in the last 168 hours. CBC: Recent Labs  Lab 06/12/23 0356 06/13/23 0350 06/14/23 0422  WBC 10.7* 11.0* 9.7  HGB 12.0 12.4 11.3*  HCT 35.1* 36.0 33.3*  MCV  92.1 88.7 90.7  PLT 253 289 229   Cardiac Enzymes: No results for input(s): "CKTOTAL", "CKMB", "CKMBINDEX", "TROPONINI" in the last 168 hours. BNP: Invalid input(s): "POCBNP" CBG: Recent Labs  Lab 06/12/23 1026  GLUCAP 120*   D-Dimer Recent Labs    06/12/23 0444  DDIMER 0.53*   Hgb A1c No  results for input(s): "HGBA1C" in the last 72 hours. Lipid Profile No results for input(s): "CHOL", "HDL", "LDLCALC", "TRIG", "CHOLHDL", "LDLDIRECT" in the last 72 hours. Thyroid function studies Recent Labs    06/12/23 0356  TSH 4.512*   Anemia work up No results for input(s): "VITAMINB12", "FOLATE", "FERRITIN", "TIBC", "IRON", "RETICCTPCT" in the last 72 hours. Urinalysis No results found for: "COLORURINE", "APPEARANCEUR", "LABSPEC", "PHURINE", "GLUCOSEU", "HGBUR", "BILIRUBINUR", "KETONESUR", "PROTEINUR", "UROBILINOGEN", "NITRITE", "LEUKOCYTESUR" Sepsis Labs Recent Labs  Lab 06/12/23 0356 06/13/23 0350 06/14/23 0422  WBC 10.7* 11.0* 9.7   Microbiology Recent Results (from the past 240 hours)  MRSA Next Gen by PCR, Nasal     Status: None   Collection Time: 06/12/23 10:28 AM   Specimen: Nasal Mucosa; Nasal Swab  Result Value Ref Range Status   MRSA by PCR Next Gen NOT DETECTED NOT DETECTED Final    Comment: (NOTE) The GeneXpert MRSA Assay (FDA approved for NASAL specimens only), is one component of a comprehensive MRSA colonization surveillance program. It is not intended to diagnose MRSA infection nor to guide or monitor treatment for MRSA infections. Test performance is not FDA approved in patients less than 37 years old. Performed at [facility], [address] [place], [place] [address]      Total time spend on discharging this patient, including the last patient exam, discussing the hospital stay, instructions for ongoing care as it relates to all pertinent caregivers, as well as preparing the medical discharge records, prescriptions, and/or referrals as  applicable, is 45 minutes.    Garrison Kanner, MD  [facility] 06/14/2023, 8:34 AM

## 2023-06-14 NOTE — Evaluation (Signed)
 Physical Therapy Evaluation Patient Details Name: Victoria Marquez MRN: [id number] DOB: Feb 24, 1923 Today's Date: 06/14/2023  History of Present Illness  SAKURA DENIS is a 89 y.o. female with medical history significant of HTN, HLD, GERD, anxiety/depression, presented with chest pain and dizziness.  heart rate in the 30s on presentation.  Clinical Impression  Pt is a 48 y.old female admitted with symptomatic bradycardia. Unable to assess pt's bed mobility as pt sitting in recliner upon entering room. STS transfers performed with CGA and use of SPC with the R UE to facilitate transition with no LOB experienced. Unable to assess use of L UE d/t sling but pt able to verbalize precautions with the use of sling. Ambulation progressed smoothly as pt able to progress limbs forward with no LOB while using the [facility]. Pt does present with some activity intolerance but states she is at her baseline level prior to admission but does want to attend respite care for a couple of days before returning to ILF. Discussed with TOC about her next steps. Pt would continue to benefit from skilled PT interventions to address endurance deficits and continue to increase mobility quality.           If plan is discharge home, recommend the following: A little help with walking and/or transfers;A little help with bathing/dressing/bathroom;Assist for transportation;Help with stairs or ramp for entrance   Can travel by private vehicle        Equipment Recommendations None recommended by PT  Recommendations for Other Services       Functional Status Assessment Patient has had a recent decline in their functional status and demonstrates the ability to make significant improvements in function in a reasonable and predictable amount of time.     Precautions / Restrictions Precautions Precautions: Other (comment) (L arm sling on for 48 hrs) Recall of Precautions/Restrictions: Intact Required Braces or Orthoses:  Sling Restrictions Weight Bearing Restrictions Per Provider Order: No      Mobility  Bed Mobility               General bed mobility comments: pt sitting in chair upon arrival    Transfers Overall transfer level: Needs assistance Equipment used: Straight cane Transfers: Sit to/from Stand Sit to Stand: Contact guard assist           General transfer comment: verbal cues given for placement of R UE on handrail, able to stand upright once standing, use of sling on L UE    Ambulation/Gait Ambulation/Gait assistance: Contact guard assist Gait Distance (Feet): 100 Feet Assistive device: Straight cane Gait Pattern/deviations: Step-through pattern, Decreased stride length, Narrow base of support       General Gait Details: pt able to ambulate at a decent pace with adequate step through pattern, with a narrow BOS, no LOB experienced throughout  Stairs            Wheelchair Mobility     Tilt Bed    Modified Rankin (Stroke Patients Only)       Balance Overall balance assessment: Needs assistance Sitting-balance support: No upper extremity supported Sitting balance-Leahy Scale: Normal     Standing balance support: Single extremity supported Standing balance-Leahy Scale: Fair                               Pertinent Vitals/Pain Pain Assessment Pain Assessment: No/denies pain    Home Living Family/patient expects to be discharged to:: Other (Comment) (independent  living)                 Home Equipment: Rollator (4 wheels);Grab bars - tub/shower;Grab bars - toilet      Prior Function Prior Level of Function : Independent/Modified Independent             Mobility Comments: pt denies any falls to date and states she has been using rollator out of comfort the last two months ADLs Comments: ind with ADLs, daughters occasionally check in on her and will be available once she D/Cs     Extremity/Trunk Assessment   Upper Extremity  Assessment Upper Extremity Assessment: Overall WFL for tasks assessed;LUE deficits/detail LUE Deficits / Details: L UE unable to test d/t sling LUE: Unable to fully assess due to immobilization    Lower Extremity Assessment Lower Extremity Assessment: Overall WFL for tasks assessed    Cervical / Trunk Assessment Cervical / Trunk Assessment: Kyphotic  Communication   Communication Communication: No apparent difficulties Factors Affecting Communication: Other (comment) (uses hearing aids)    Cognition Arousal: Alert Behavior During Therapy: WFL for tasks assessed/performed   PT - Cognitive impairments: No apparent impairments                         Following commands: Intact       Cueing Cueing Techniques: Verbal cues, Tactile cues     General Comments      Exercises     Assessment/Plan    PT Assessment Patient needs continued PT services  PT Problem List Decreased balance       PT Treatment Interventions Gait training;Stair training;Balance training    PT Goals (Current goals can be found in the Care Plan section)  Acute Rehab PT Goals Patient Stated Goal: return home PT Goal Formulation: With patient Time For Goal Achievement: 06/28/23 Potential to Achieve Goals: Good    Frequency Min 1X/week     Co-evaluation               AM-PAC PT "6 Clicks" Mobility  Outcome Measure Help needed turning from your back to your side while in a flat bed without using bedrails?: None Help needed moving from lying on your back to sitting on the side of a flat bed without using bedrails?: None Help needed moving to and from a bed to a chair (including a wheelchair)?: A Little Help needed standing up from a chair using your arms (e.g., wheelchair or bedside chair)?: A Little Help needed to walk in hospital room?: A Little Help needed climbing 3-5 steps with a railing? : A Little 6 Click Score: 20    End of Session Equipment Utilized During Treatment:  Gait belt Activity Tolerance: Patient tolerated treatment well Patient left: in chair;with call bell/phone within reach;with chair alarm set Nurse Communication: Mobility status;Other (comment) (pt preferance on d/c) PT Visit Diagnosis: Other abnormalities of gait and mobility (R26.89)    Time: 8295-6213 PT Time Calculation (min) (ACUTE ONLY): 20 min   Charges:   PT Evaluation $PT Eval Low Complexity: 1 Low PT Treatments $Gait Training: 8-22 mins PT General Charges $$ ACUTE PT VISIT: 1 Visit           Nadeem Romanoski Romero-Perozo, SPT  06/14/2023, 1:57 PM

## 2023-06-14 NOTE — Care Management Important Message (Signed)
 Important Message  Patient Details  Name: Victoria Marquez MRN: [id number] Date of Birth: 10-Feb-1923   Important Message Given:  Yes - Medicare IM     Anise Kerns 06/14/2023, 12:36 PM

## 2023-06-14 NOTE — Progress Notes (Signed)
 [facility] CLINIC CARDIOLOGY PROGRESS NOTE       Patient ID: Victoria Marquez MRN: [id number] DOB/AGE: 88/09/1923 88 y.o.  Admit date: 06/12/2023 Referring Physician Dr. Antoniette Batty Primary Physician Melchor Spoon, MD Primary Cardiologist Dr. Braxton Calico Reason for Consultation symptomatic bradycardia  HPI: [name] is a 89 y.o. female  with a past medical history of hypertension and hyperlipemia who presented to the ED on 06/12/2023 for generalized weakness, fatigue. Patient found to have symptomatic bradycardia. EKG in ED revealed junctional escape rhythm. Patient does not take AVN blockers at home. Cardiology was consulted for further evaluation.   Interval History:   -Patient seen and examined this AM and laying comfortably in hospital bed. Patient states she's feeling well s/p PPM and denies any cardiac sxs.  -Patients BP and HR stable s/p PPM (06/04). EKG post-op with atrial pacing in 60s. Per tele pacemaker is capturing adequately. Intermittent atrial-pacing with rate 60s.  -Patient remains on room air with stable SpO2.  -Left side of chest Incision site is clean and dry with no evidence of significant swelling, bruising, or active bleeding. -Post-Op CXR with no evidence of PTX.    Review of systems complete and found to be negative unless listed above    Past Medical History:  Diagnosis Date   Actinic keratosis    Anxiety    Cancer (HCC)    Colon polyps    Depression    Hyperglycemia    Hyperlipemia    Osteopenia    Urinary incontinence    Vitamin D deficiency     Past Surgical History:  Procedure Laterality Date   BLADDER SURGERY     EYE SURGERY     PACEMAKER IMPLANT N/A 06/13/2023   Procedure: PACEMAKER IMPLANT;  Surgeon: Percival Brace, MD;  Location: [facility] INVASIVE CV LAB;  Service: Cardiovascular;  Laterality: N/A;    Medications Prior to Admission  Medication Sig Dispense Refill Last Dose/Taking   alendronate (FOSAMAX) 70 MG tablet    06/11/2023  Morning   [Paused] amLODipine (NORVASC) 5 MG tablet Take 5 mg by mouth 2 (two) times daily.  4 06/11/2023   azelastine (ASTELIN) 0.1 % nasal spray Place into the nose.   Taking   Calcium Carbonate-Vitamin D 600-200 MG-UNIT TABS Take by mouth.   Taking   cetirizine  (ZYRTEC ) 5 MG tablet Take 1 tablet (5 mg total) by mouth at bedtime for 5 days. 5 tablet 0 06/11/2023   chlorthalidone (HYGROTON) 25 MG tablet Take 12.5 mg by mouth daily.   06/11/2023   Cholecalciferol 25 MCG (1000 UT) tablet Take 2,000 Units by mouth daily.   Taking   estradiol (ESTRACE) 0.1 MG/GM vaginal cream Place 1 Applicatorful vaginally at bedtime.   Taking   fluticasone (FLONASE) 50 MCG/ACT nasal spray Place into the nose.   06/11/2023   Folic Acid 20 MG CAPS Take by mouth.   06/11/2023   losartan (COZAAR) 100 MG tablet Take 100 mg by mouth daily.   06/11/2023   Omega-3 Fatty Acids (FISH OIL) 1000 MG CAPS Take by mouth.   06/11/2023   ondansetron  (ZOFRAN -ODT) 4 MG disintegrating tablet Take 4 mg by mouth every 8 (eight) hours as needed.   Taking As Needed   [Paused] telmisartan (MICARDIS) 80 MG tablet Take 80 mg by mouth.   Taking   Trospium Chloride 60 MG CP24 trospium ER 60 mg capsule,extended release 24 hr   06/11/2023   amoxicillin (AMOXIL) 875 MG tablet Take by mouth. (Patient not taking:  Reported on 06/12/2023)   Not Taking   aspirin  EC 81 MG tablet Take 81 mg by mouth daily. (Patient not taking: Reported on 06/12/2023)   Not Taking   Influenza vac split quadrivalent PF (FLUZONE HIGH-DOSE) 0.5 ML injection Fluzone High-Dose 2018-2019 (PF) 180 mcg/0.5 mL intramuscular syringe  TO BE ADMINISTERED BY PHARMACIST FOR IMMUNIZATION      pantoprazole (PROTONIX) 40 MG tablet  (Patient not taking: Reported on 06/12/2023)   Not Taking   valACYclovir  (VALTREX ) 1000 MG tablet Take 1 tablet (1,000 mg total) by mouth 3 (three) times daily. (Patient not taking: Reported on 06/12/2023) 21 tablet 0 Not Taking   Zoster Vaccine Adjuvanted [facility]) injection  Shingrix (PF) 50 mcg/0.5 mL intramuscular suspension, kit      Social History   Socioeconomic History   Marital status: Widowed    Spouse name: Not on file   Number of children: Not on file   Years of education: Not on file   Highest education level: Not on file  Occupational History   Not on file  Tobacco Use   Smoking status: Former    Current packs/day: 0.00    Types: Cigarettes    Quit date: 07/22/1990    Years since quitting: 32.9   Smokeless tobacco: Never  Vaping Use   Vaping status: Never Used  Substance and Sexual Activity   Alcohol use: Yes    Alcohol/week: 0.0 standard drinks of alcohol   Drug use: No   Sexual activity: Not Currently  Other Topics Concern   Not on file  Social History Narrative   Not on file   Social Drivers of Health   Financial Resource Strain: Not on file  Food Insecurity: No Food Insecurity (06/12/2023)   Hunger Vital Sign    Worried About Running Out of Food in the Last Year: Never true    Ran Out of Food in the Last Year: Never true  Transportation Needs: No Transportation Needs (06/12/2023)   PRAPARE - Administrator, Civil Service (Medical): No    Lack of Transportation (Non-Medical): No  Physical Activity: Not on file  Stress: Not on file  Social Connections: Moderately Integrated (06/12/2023)   Social Connection and Isolation Panel [NHANES]    Frequency of Communication with Friends and Family: Three times a week    Frequency of Social Gatherings with Friends and Family: Three times a week    Attends Religious Services: 1 to 4 times per year    Active Member of Clubs or Organizations: Yes    Attends Banker Meetings: More than 4 times per year    Marital Status: Widowed  Intimate Partner Violence: Not At Risk (06/12/2023)   Humiliation, Afraid, Rape, and Kick questionnaire    Fear of Current or Ex-Partner: No    Emotionally Abused: No    Physically Abused: No    Sexually Abused: No    History reviewed. No  pertinent family history.   Vitals:   06/13/23 1934 06/14/23 0000 06/14/23 0428 06/14/23 0750  BP: (!) 104/59 (!) 130/55 (!) 148/65 128/67  Pulse: 63 60 60 69  Resp: 20 15 20 14   Temp: 99.1 F (37.3 C) 98 F (36.7 C) 97.8 F (36.6 C) 98.1 F (36.7 C)  TempSrc: Oral Oral Oral   SpO2: 96% 97% 96% 97%  Weight:        PHYSICAL EXAM General: well appearing elderly female, well nourished, in no acute distress. HEENT: Normocephalic and atraumatic. Neck: No JVD.  Lungs: Normal respiratory effort on room air. Clear bilaterally to auscultation. No wheezes, crackles, rhonchi.  Heart: HRRR. Normal S1 and S2 without gallops or murmurs. Left chest incision site clear, dry without significant swelling or tenderness. Abdomen: Non-distended appearing.  Msk: Normal strength and tone for age. Extremities: Warm and well perfused. No clubbing, cyanosis. No edema.  Neuro: Alert and oriented X 3. Psych: Answers questions appropriately.   Labs: Basic Metabolic Panel: Recent Labs    06/12/23 0356 06/13/23 0350 06/14/23 0422  NA 124* 131* 131*  K 4.2 4.0 3.5  CL 94* 98 100  CO2 19* 24 24  GLUCOSE 116* 122* 98  BUN 45* 35* 34*  CREATININE 1.51* 1.12* 0.98  CALCIUM 8.6* 9.6 8.7*  MG 2.1  --  2.0   Liver Function Tests: Recent Labs    06/12/23 0356  AST 44*  ALT 34  ALKPHOS 44  BILITOT 0.9  PROT 6.3*  ALBUMIN 3.6   No results for input(s): "LIPASE", "AMYLASE" in the last 72 hours. CBC: Recent Labs    06/13/23 0350 06/14/23 0422  WBC 11.0* 9.7  HGB 12.4 11.3*  HCT 36.0 33.3*  MCV 88.7 90.7  PLT 289 229   Cardiac Enzymes: Recent Labs    06/12/23 0356 06/12/23 0621  TROPONINIHS 16 17   BNP: Recent Labs    06/12/23 1129  BNP 479.0*   D-Dimer: Recent Labs    06/12/23 0444  DDIMER 0.53*   Hemoglobin A1C: No results for input(s): "HGBA1C" in the last 72 hours. Fasting Lipid Panel: No results for input(s): "CHOL", "HDL", "LDLCALC", "TRIG", "CHOLHDL", "LDLDIRECT"  in the last 72 hours. Thyroid Function Tests: Recent Labs    06/12/23 0356  TSH 4.512*   Anemia Panel: No results for input(s): "VITAMINB12", "FOLATE", "FERRITIN", "TIBC", "IRON", "RETICCTPCT" in the last 72 hours.   Radiology: [facility] Chest Port 1 View Result Date: 06/13/2023 CLINICAL DATA:  Pacemaker placement. EXAM: PORTABLE CHEST 1 VIEW COMPARISON:  June 12, 2023. FINDINGS: Stable cardiomediastinal silhouette. Left-sided pacemaker is noted with leads in grossly good position. No pneumothorax. Both lungs are clear. The visualized skeletal structures are unremarkable. IMPRESSION: Interval placement of left-sided pacemaker. Electronically Signed   By: Rosalene Colon M.D.   On: 06/13/2023 14:13   EP PPM/ICD IMPLANT Result Date: 06/13/2023 Successful dual-chamber pacemaker implantation (Medtronic Azure XT DR MRI)   ECHOCARDIOGRAM COMPLETE Result Date: 06/12/2023    ECHOCARDIOGRAM REPORT   Patient Name:   KELSIE ZABOROWSKI Date of Exam: 06/12/2023 Medical Rec #:  [id number]        Height:       63.0 in Accession #:    [id number]       Weight:       117.1 lb Date of Birth:  February 24, 1923         BSA:          1.540 m Patient Age:    89 years         BP:           156/84 mmHg Patient Gender: F                HR:           76 bpm. Exam Location:  [facility] Procedure: 2D Echo, Cardiac Doppler and Color Doppler (Both Spectral and Color            Flow Doppler were utilized during procedure). Indications:     Other abnormalities of the heart R00.8  History:         Patient has no prior history of Echocardiogram examinations.                  Risk Factors:Dyslipidemia. Anxiety.  Sonographer:     Broadus Canes Referring Phys:  [id number] Shamiracle Gorden Diagnosing Phys: Lida Reeks Alluri IMPRESSIONS  1. Left ventricular ejection fraction, by estimation, is 65 to 70%. The left ventricle has normal function. The left ventricle has no regional wall motion abnormalities. There is mild left ventricular hypertrophy. Left ventricular diastolic  parameters are consistent with Grade I diastolic dysfunction (impaired relaxation).  2. Right ventricular systolic function is normal. The right ventricular size is normal. There is moderately elevated pulmonary artery systolic pressure.  3. Left atrial size was moderately dilated.  4. The mitral valve is normal in structure. Trivial mitral valve regurgitation.  5. The aortic valve is tricuspid. Aortic valve regurgitation is not visualized.  6. The inferior vena cava is normal in size with <50% respiratory variability, suggesting right atrial pressure of 8 mmHg. FINDINGS  Left Ventricle: Left ventricular ejection fraction, by estimation, is 65 to 70%. The left ventricle has normal function. The left ventricle has no regional wall motion abnormalities. The left ventricular internal cavity size was normal in size. There is  mild left ventricular hypertrophy. Left ventricular diastolic parameters are consistent with Grade I diastolic dysfunction (impaired relaxation). Right Ventricle: The right ventricular size is normal. No increase in right ventricular wall thickness. Right ventricular systolic function is normal. There is moderately elevated pulmonary artery systolic pressure. The tricuspid regurgitant velocity is 3.24 m/s, and with an assumed right atrial pressure of 8 mmHg, the estimated right ventricular systolic pressure is 50.0 mmHg. Left Atrium: Left atrial size was moderately dilated. Right Atrium: Right atrial size was normal in size. Pericardium: There is no evidence of pericardial effusion. Mitral Valve: The mitral valve is normal in structure. Trivial mitral valve regurgitation. MV peak gradient, 4.8 mmHg. The mean mitral valve gradient is 2.0 mmHg. Tricuspid Valve: The tricuspid valve is normal in structure. Tricuspid valve regurgitation is mild. Aortic Valve: The aortic valve is tricuspid. Aortic valve regurgitation is not visualized. Aortic valve mean gradient measures 6.0 mmHg. Aortic valve peak  gradient measures 9.9 mmHg. Aortic valve area, by VTI measures 2.48 cm. Pulmonic Valve: The pulmonic valve was not well visualized. Pulmonic valve regurgitation is trivial. Aorta: The aortic root is normal in size and structure. Venous: The inferior vena cava is normal in size with less than 50% respiratory variability, suggesting right atrial pressure of 8 mmHg. IAS/Shunts: The atrial septum is grossly normal.  LEFT VENTRICLE PLAX 2D LVIDd:         3.60 cm   Diastology LVIDs:         2.60 cm   LV e' medial:    5.98 cm/s LV PW:         1.20 cm   LV E/e' medial:  16.0 LV IVS:        1.60 cm   LV e' lateral:   8.81 cm/s LVOT diam:     2.00 cm   LV E/e' lateral: 10.9 LV SV:         89 LV SV Index:   58 LVOT Area:     3.14 cm  RIGHT VENTRICLE RV Basal diam:  3.00 cm RV Mid diam:    2.90 cm RV S prime:     15.60 cm/s TAPSE (M-mode): 2.4 cm LEFT ATRIUM  Index        RIGHT ATRIUM           Index LA diam:        2.80 cm 1.82 cm/m   RA Area:     13.20 cm LA Vol (A2C):   90.7 ml 58.90 ml/m  RA Volume:   28.60 ml  18.57 ml/m LA Vol (A4C):   50.9 ml 33.05 ml/m LA Biplane Vol: 72.2 ml 46.88 ml/m  AORTIC VALVE AV Area (Vmax):    2.15 cm AV Area (Vmean):   2.21 cm AV Area (VTI):     2.48 cm AV Vmax:           157.50 cm/s AV Vmean:          111.000 cm/s AV VTI:            0.359 m AV Peak Grad:      9.9 mmHg AV Mean Grad:      6.0 mmHg LVOT Vmax:         108.00 cm/s LVOT Vmean:        78.200 cm/s LVOT VTI:          0.283 m LVOT/AV VTI ratio: 0.79  AORTA Ao Root diam: 3.20 cm MITRAL VALVE               TRICUSPID VALVE MV Area (PHT): 2.85 cm    TR Peak grad:   42.0 mmHg MV Area VTI:   1.94 cm    TR Vmax:        324.00 cm/s MV Peak grad:  4.8 mmHg MV Mean grad:  2.0 mmHg    SHUNTS MV Vmax:       1.09 m/s    Systemic VTI:  0.28 m MV Vmean:      67.2 cm/s   Systemic Diam: 2.00 cm MV Decel Time: 266 msec MV E velocity: 95.90 cm/s MV A velocity: 83.30 cm/s MV E/A ratio:  1.15 Joetta Mustache Electronically signed by  Joetta Mustache Signature Date/Time: 06/12/2023/4:39:13 PM    Final    DG Chest Port 1 View Result Date: 06/12/2023 CLINICAL DATA:  Chest pain EXAM: PORTABLE CHEST 1 VIEW COMPARISON:  None Available. FINDINGS: The lungs are symmetrically hyperinflated in keeping with changes of underlying COPD. The lungs are clear. No pneumothorax or pleural effusion. Cardiac size is within normal limits. Pulmonary vascularity is normal. Osseous structures are age appropriate. Healed right proximal humeral fracture. IMPRESSION: 1. No active disease. COPD. Electronically Signed   By: Worthy Heads M.D.   On: 06/12/2023 04:19    ECHO as above  TELEMETRY reviewed by me 06/14/2023: intermittent atrial pacing in 60s   EKG reviewed by me: Junctional escape rhythm, rate 36 bpm  Data reviewed by me 06/14/2023: last 24h vitals tele labs imaging I/O hospitalist progress note  Principal Problem:   Symptomatic bradycardia Active Problems:   Bradycardia    ASSESSMENT AND PLAN:   Victoria Marquez is a 89 y.o. female  with a past medical history of hypertension and hyperlipemia who presented to the ED on 06/12/2023 for generalized weakness, fatigue. Patient found to have symptomatic bradycardia. EKG in ED revealed junctional escape rhythm. Patient does not take AVN blockers at home and electrolytes stable. Cardiology was consulted for further evaluation.   # Sinus node dysfunction s/p PPM (Medtronic Azure XT DR MRI) - 06/13/23 # Hypertension # Hyperlipidemia Patient presents with symptomatic bradycardia and found to have junctional escape rhythm in 30s. Patient lives  alone and performs all ADLs, and walks with walker with no difficuly. Patient received 1x dose 0.5 mg atropine that helped bring up HR to 50s. Patient started on low dose dopamine, HR remains stable on dopamine. Pads in place in ED. BNP elevated at 470. Trops negative x2. Echo this admission with pEF (65-70%) with no RWMA, grade 1 diastolic dysfunction, mod elevated  PASP, mod dilated LA. Underwent PPM by Dr. Parks Bollman (06/04) and patient tolerated procedure well. EKG post-op with atrial pacing in 60s. Per tele pacemaker is capturing adequately. Intermittent atrial-pacing with rate 60s. Post-op CXR with no evidence of PTX. -Monitor and replenish electrolytes for a goal K >4, Mag >2  -Continue ABX prophylaxis with Keflex 500 mg BID tomorrow for 10 days. -Consider resuming home antihypertensive medication when BP improves/stabilizes.  -Patient's son states patient lives at [facility] and will have 3 days of strict monitoring after procedure.  -Discussed PPM care instruction with patient and family.    Ok for discharge today from a cardiac perspective. Will arrange for follow up in clinic with Dr. Custovic in 1-2 weeks.   This patient's plan of care was discussed and created with Dr. Bob Burn and he is in agreement.  Signed: Creighton Doffing, PA-C  06/14/2023, 8:41 AM [facility] Cardiology

## 2023-09-10 ENCOUNTER — Encounter: Payer: Medicare HMO | Admitting: Dermatology

## 2023-09-11 ENCOUNTER — Encounter: Payer: Medicare HMO | Admitting: Dermatology
